# Patient Record
Sex: Female | Born: 1970 | Race: Black or African American | Hispanic: No | Marital: Single | Smoking: Former smoker
Health system: Southern US, Community
[De-identification: ages and names within clinical notes are randomized; demographics above are authoritative.]

## PROBLEM LIST (undated history)

## (undated) HISTORY — PX: ABDOMINAL HYSTERECTOMY: SHX81

## (undated) HISTORY — PX: TUBAL LIGATION: SHX77

---

## 2020-11-17 ENCOUNTER — Emergency Department (HOSPITAL_BASED_OUTPATIENT_CLINIC_OR_DEPARTMENT_OTHER)
Admission: EM | Admit: 2020-11-17 | Discharge: 2020-11-18 | Disposition: A | Discharge status: Home / Self Care | Payer: Self-pay | Attending: Emergency Medicine | Admitting: Emergency Medicine

## 2020-11-17 ENCOUNTER — Other Ambulatory Visit: Payer: Self-pay

## 2020-11-17 ENCOUNTER — Encounter (HOSPITAL_BASED_OUTPATIENT_CLINIC_OR_DEPARTMENT_OTHER): Payer: Self-pay | Admitting: Emergency Medicine

## 2020-11-17 ENCOUNTER — Emergency Department (HOSPITAL_BASED_OUTPATIENT_CLINIC_OR_DEPARTMENT_OTHER): Payer: Self-pay

## 2020-11-17 DIAGNOSIS — Z20822 Contact with and (suspected) exposure to covid-19: Secondary | ICD-10-CM | POA: Insufficient documentation

## 2020-11-17 DIAGNOSIS — R410 Disorientation, unspecified: Secondary | ICD-10-CM

## 2020-11-17 DIAGNOSIS — R4182 Altered mental status, unspecified: Secondary | ICD-10-CM | POA: Insufficient documentation

## 2020-11-17 DIAGNOSIS — F1290 Cannabis use, unspecified, uncomplicated: Secondary | ICD-10-CM | POA: Insufficient documentation

## 2020-11-17 DIAGNOSIS — R200 Anesthesia of skin: Secondary | ICD-10-CM | POA: Insufficient documentation

## 2020-11-17 DIAGNOSIS — R109 Unspecified abdominal pain: Secondary | ICD-10-CM | POA: Insufficient documentation

## 2020-11-17 DIAGNOSIS — F17210 Nicotine dependence, cigarettes, uncomplicated: Secondary | ICD-10-CM | POA: Insufficient documentation

## 2020-11-17 LAB — URINALYSIS, MICROSCOPIC (REFLEX): WBC, UA: NONE SEEN WBC/hpf (ref 0–5)

## 2020-11-17 LAB — COMPREHENSIVE METABOLIC PANEL
ALT: 10 U/L (ref 0–44)
AST: 16 U/L (ref 15–41)
Albumin: 4.3 g/dL (ref 3.5–5.0)
Alkaline Phosphatase: 85 U/L (ref 38–126)
Anion gap: 7 (ref 5–15)
BUN: <5 mg/dL — ABNORMAL LOW (ref 6–20)
CO2: 27 mmol/L (ref 22–32)
Calcium: 9.8 mg/dL (ref 8.9–10.3)
Chloride: 105 mmol/L (ref 98–111)
Creatinine, Ser: 0.87 mg/dL (ref 0.44–1.00)
GFR, Estimated: >60 mL/min (ref >60)
Glucose, Bld: 117 mg/dL — ABNORMAL HIGH (ref 70–99)
Potassium: 4 mmol/L (ref 3.5–5.1)
Sodium: 139 mmol/L (ref 135–145)
Total Bilirubin: 0.5 mg/dL (ref 0.3–1.2)
Total Protein: 8 g/dL (ref 6.5–8.1)

## 2020-11-17 LAB — RAPID URINE DRUG SCREEN, HOSP PERFORMED
Amphetamines: NOT DETECTED
Barbiturates: NOT DETECTED
Benzodiazepines: NOT DETECTED
Cocaine: NOT DETECTED
Opiates: NOT DETECTED
Tetrahydrocannabinol: POSITIVE — AB

## 2020-11-17 LAB — URINALYSIS, ROUTINE W REFLEX MICROSCOPIC
Bilirubin Urine: NEGATIVE
Glucose, UA: NEGATIVE mg/dL
Ketones, ur: NEGATIVE mg/dL
Leukocytes,Ua: NEGATIVE
Nitrite: NEGATIVE
Protein, ur: NEGATIVE mg/dL
Specific Gravity, Urine: <1.005 — ABNORMAL LOW (ref 1.005–1.030)
pH: 7 (ref 5.0–8.0)

## 2020-11-17 LAB — CBC
HCT: 39.1 % (ref 36.0–46.0)
Hemoglobin: 13.8 g/dL (ref 12.0–15.0)
MCH: 33.9 pg (ref 26.0–34.0)
MCHC: 35.3 g/dL (ref 30.0–36.0)
MCV: 96.1 fL (ref 80.0–100.0)
Platelets: 343 10*3/uL (ref 150–400)
RBC: 4.07 MIL/uL (ref 3.87–5.11)
RDW: 13.1 % (ref 11.5–15.5)
WBC: 11.8 10*3/uL — ABNORMAL HIGH (ref 4.0–10.5)
nRBC: 0 % (ref 0.0–0.2)

## 2020-11-17 LAB — RESP PANEL BY RT-PCR (FLU A&B, COVID) ARPGX2
Influenza A by PCR: NEGATIVE
Influenza B by PCR: NEGATIVE
SARS Coronavirus 2 by RT PCR: NEGATIVE

## 2020-11-17 LAB — PREGNANCY, URINE: Preg Test, Ur: NEGATIVE

## 2020-11-17 LAB — PHOSPHORUS: Phosphorus: 3.2 mg/dL (ref 2.5–4.6)

## 2020-11-17 LAB — MAGNESIUM: Magnesium: 1.9 mg/dL (ref 1.7–2.4)

## 2020-11-17 LAB — LIPASE, BLOOD: Lipase: 26 U/L (ref 11–51)

## 2020-11-17 IMAGING — CT CT HEAD W/O CM
3 series · 16 of 47 positions shown, 19 images · non-contrast
Comparison: None.

CLINICAL DATA: Mental status change

EXAM:
CT HEAD WITHOUT CONTRAST
TECHNIQUE: Contiguous axial images were obtained from the base of the skull
through the vertex without intravenous contrast.

[Series 2: head wo · axial · 0.43mm/px · z∈[+1058,+1188]mm · 10 of 32 slices shown, 13 images]
[im 3/32  brain]
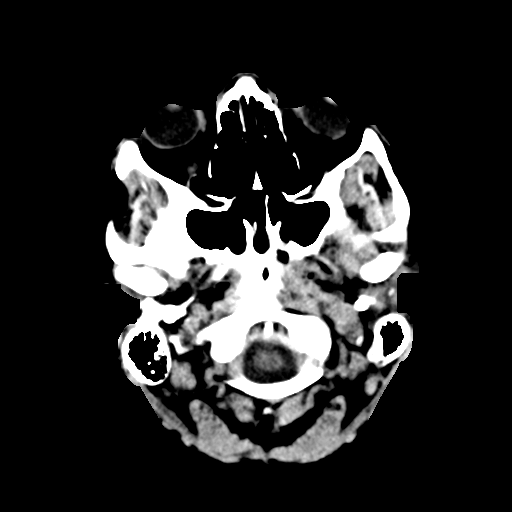
[im 3/32  bone]
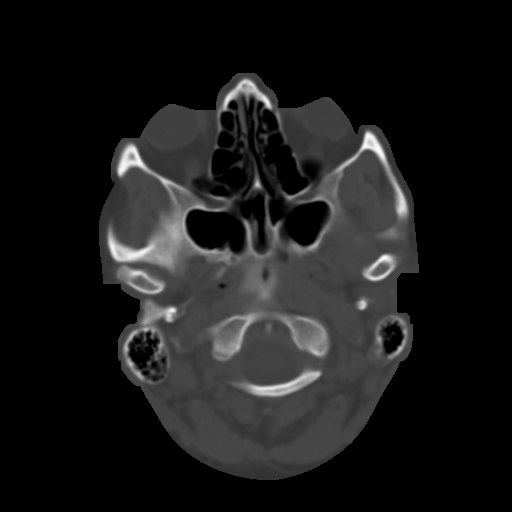
[im 6/32  brain]
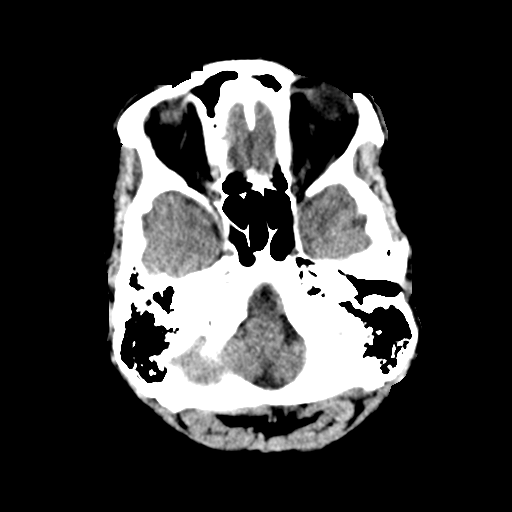
[im 9/32  brain]
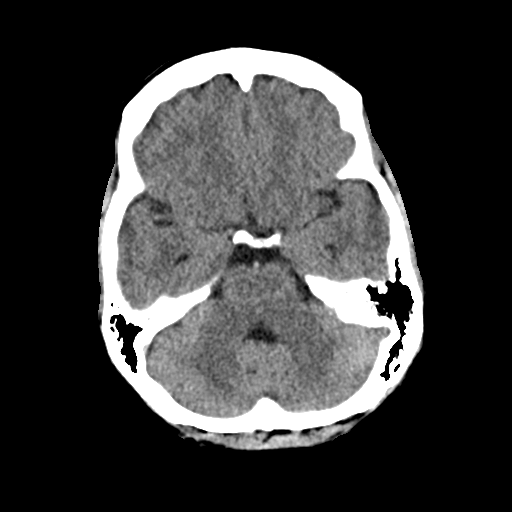
[im 11/32  brain]
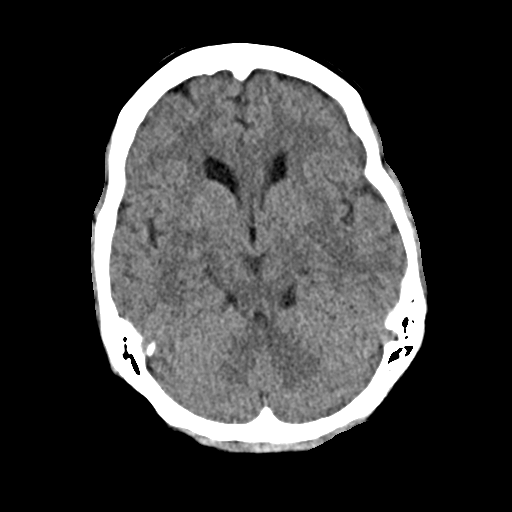
[im 14/32  brain]
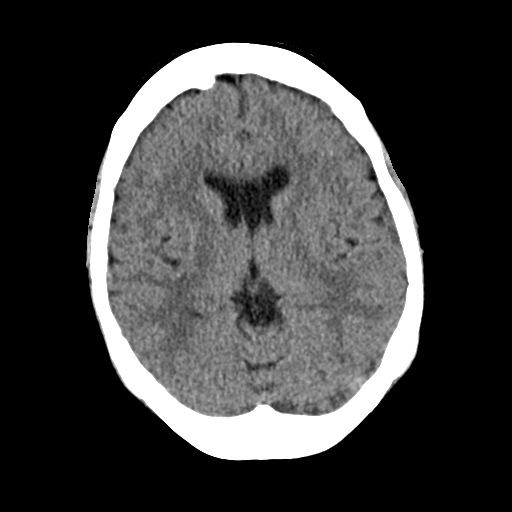
[im 14/32  bone]
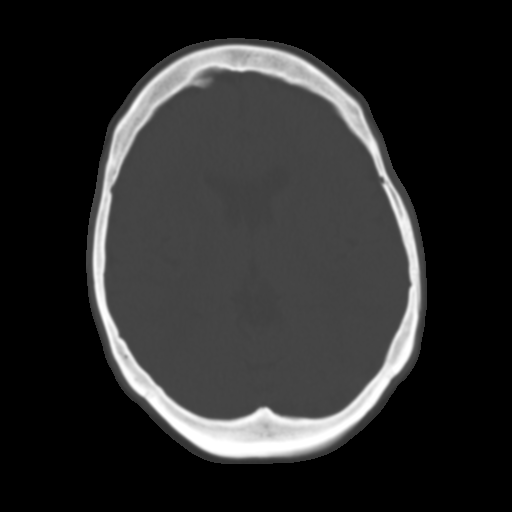
[im 18/32  brain]
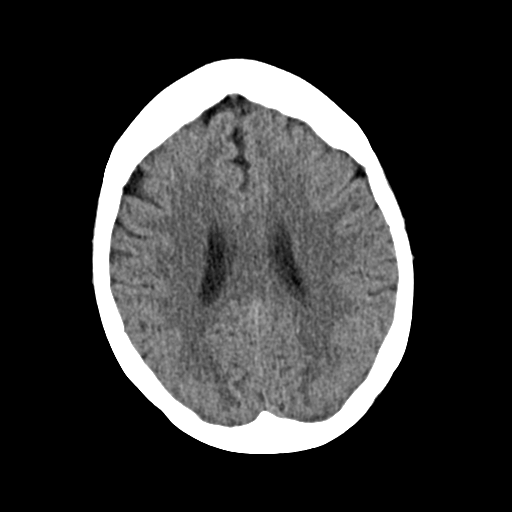
[im 21/32  brain]
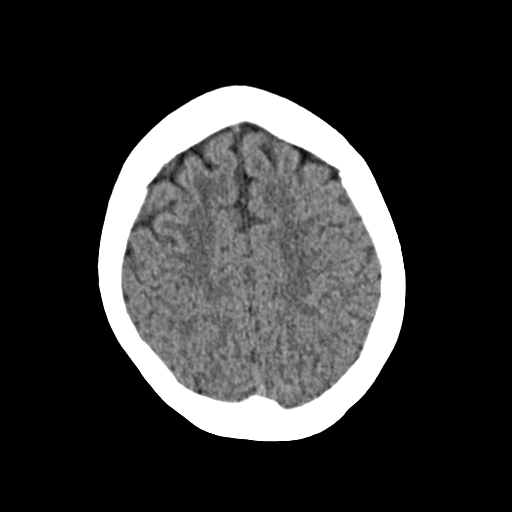
[im 24/32  brain]
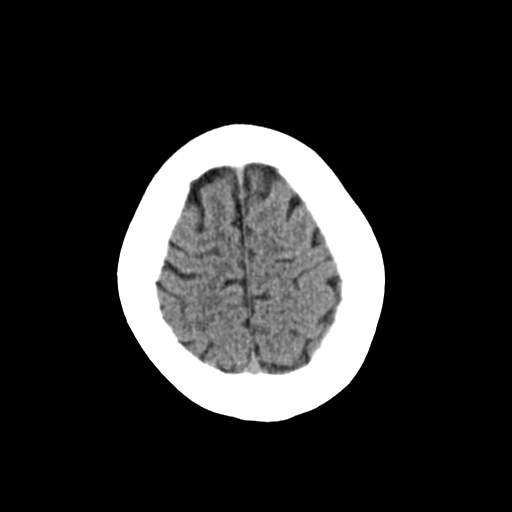
[im 26/32  brain]
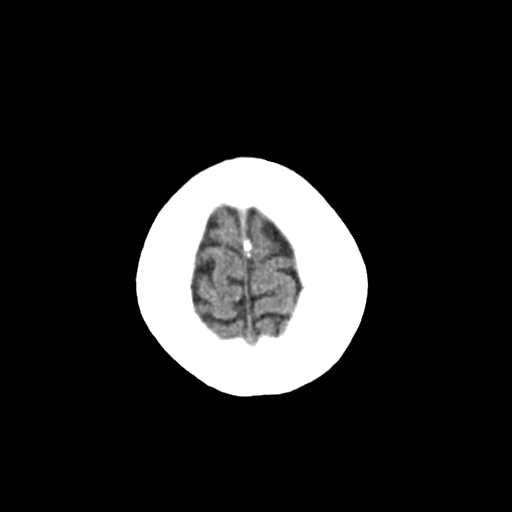
[im 26/32  bone]
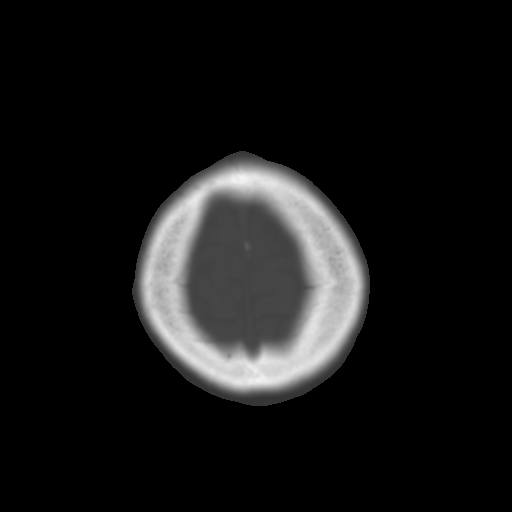
[im 29/32  brain]
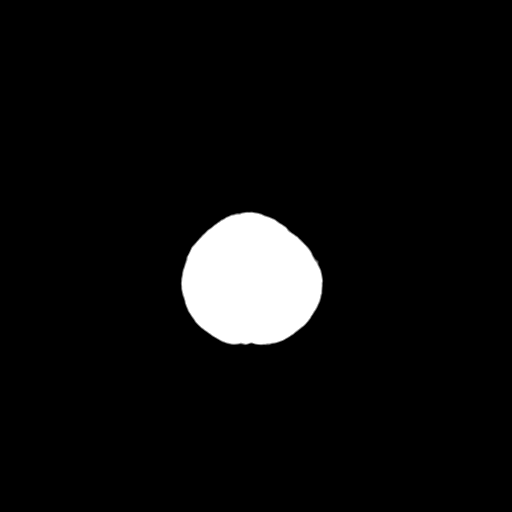

[Series 5: coronal soft · coronal · 0.32mm/px · 3 of 68 slices shown]
[im 23/68  brain]
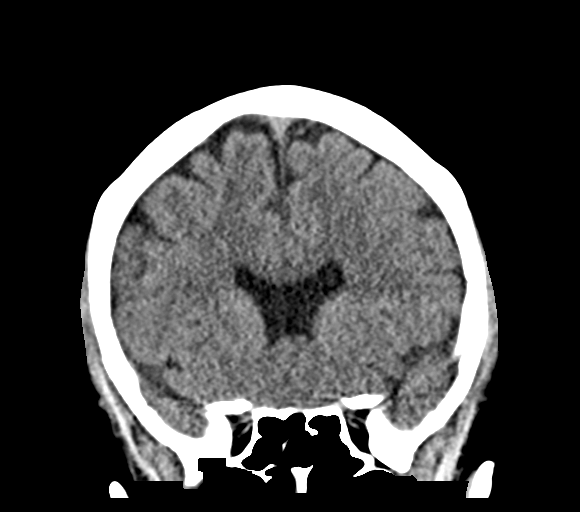
[im 30/68  brain]
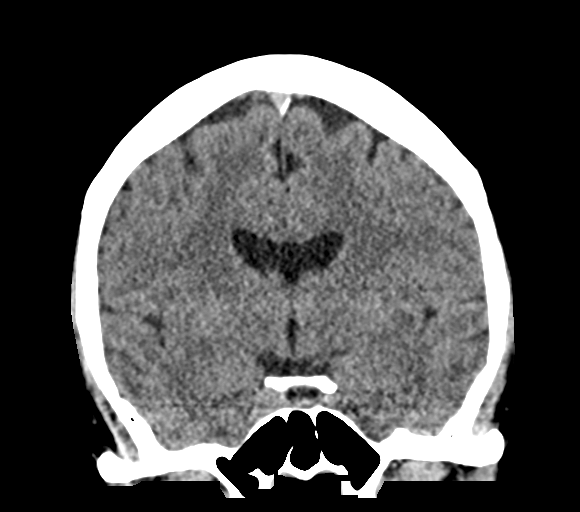
[im 38/68  brain]
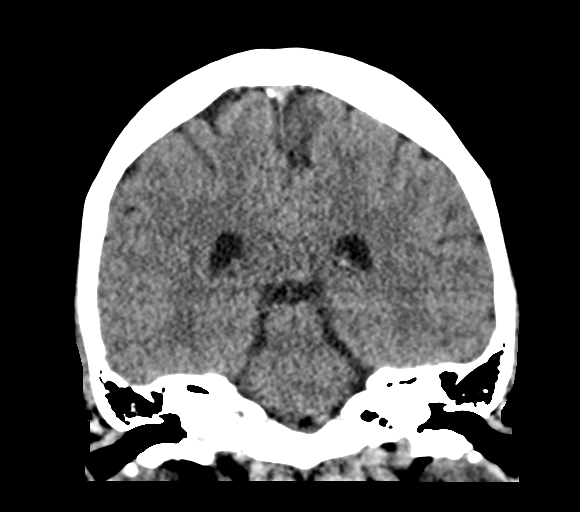

[Series 6: sag soft · sagittal · 0.32mm/px · 3 of 60 slices shown]
[im 20/60  brain]
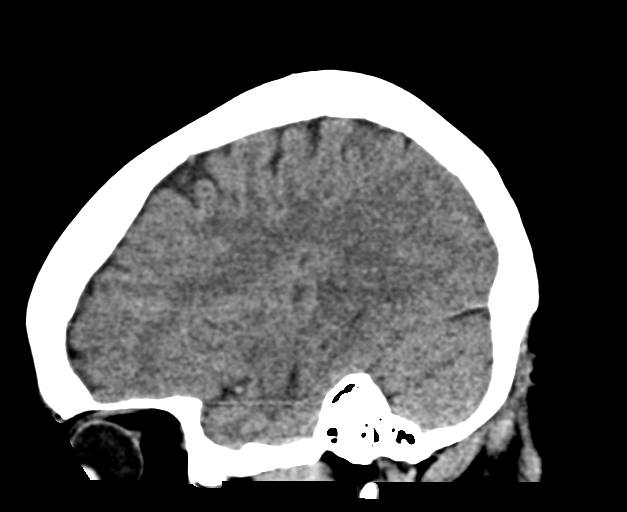
[im 30/60  brain]
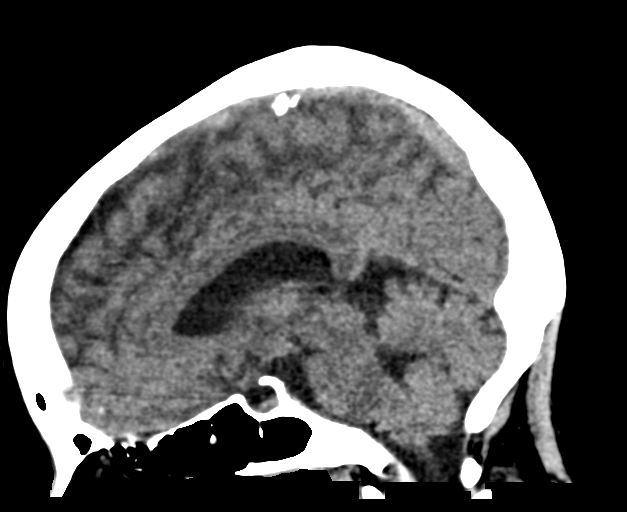
[im 40/60  brain]
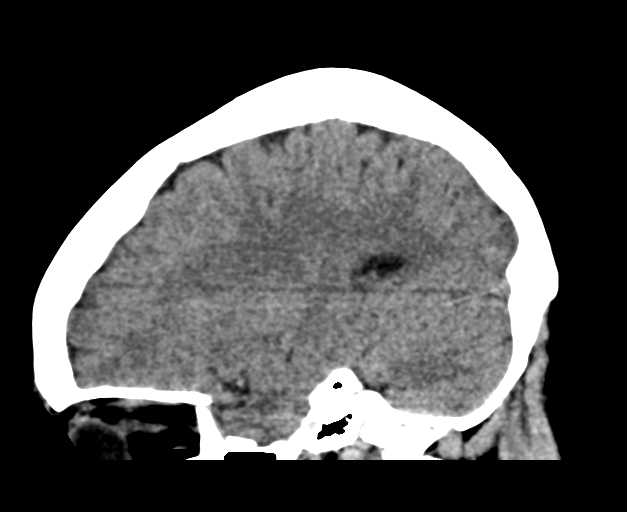

[16 of 47 positions shown; findings below may reference images not displayed]

FINDINGS: Brain: No evidence of acute infarction, hemorrhage, hydrocephalus,
extra-axial collection or mass lesion/mass effect.

Vascular: No hyperdense vessel or unexpected calcification.

Skull: Normal. Negative for fracture or focal lesion.

Sinuses/Orbits: No acute finding.

Other: None
IMPRESSION: Negative non contrasted CT appearance of the brain

## 2020-11-17 MED ORDER — LORAZEPAM 2 MG/ML IJ SOLN
1.0000 mg | Freq: Once | INTRAMUSCULAR | Status: AC | PRN
  Administered 2020-11-18: 1 mg via INTRAVENOUS
  Filled 2020-11-17: qty 1

## 2020-11-17 NOTE — ED Triage Notes (Signed)
Reports having abdominal pain for the last four days in the morning.  Today went on all day.  Also reports sudden onset of confusion at 2pm.  Was seen by ems and was told it was probably dehydration.  Reports she drank a lot of water and gatorade.   Was told to come in if she felt worse again.  Now reports right arm tingling and feels weaker than the left.

## 2020-11-17 NOTE — ED Provider Notes (Signed)
[facility] EMERGENCY DEPARTMENT Provider Note   CSN: [id number] Arrival date & time: 11/17/20  1832     History Chief Complaint  Patient presents with   Abdominal Pain    Victoria Stevenson is a 50 y.o. female.  HPI Reports she was sitting out with her daughter and granddaughter visiting this afternoon.  They were on a shaded porch.  She reports that they were enjoying themselves and she very suddenly felt extremely thirsty.  She reports quite quickly then she felt like she was disoriented and like the right side of her face was contracting.  She reports at that time she had a hard time organizing her speech.  She reports she was talking but things were not coming out correctly.  She was able to walk into the house and sit down, noting at that time that her right arm had a sensation of numbness to it.  She reports she can move it and use it but the arm felt like it was numb.  This did not include the left arm or the lower extremities.  She was still trying to talk to her daughter but her daughter is having a hard time understanding her.  She reports it just seem like was taking her a lot longer to be able to find the words and to get them out.  EMS was called and assessed the patient.  She reports at that time it was thought that maybe she had a panic attack.  However, she notes she does not really know what a panic tach is and she has never been diagnosed with it before.  History does not suggest that she was under a lot of stress at that particular juncture. patient denies any history of similar episode.  He has not have medical problems.  Patient reports she does smoke cigarettes.  She reports rare marijuana use, last use about a week ago. Rare alcohol use, none recently.  Patient reports only noticing this been going on is that she has been getting intermittent cramping abdominal pains.  She has not had any vomiting or diarrhea.  No fevers.  No generalized headaches or body aches.  She  reports she will occasionally get a pain that feels like she has to have a bowel movement.     History reviewed. No pertinent past medical history.  There are no problems to display for this patient.   History reviewed. No pertinent surgical history.   OB History   No obstetric history on file.     No family history on file.  Social History   Tobacco Use   Smoking status: Every Day    Types: Cigarettes  Substance Use Topics   Alcohol use: Yes    Home Medications Prior to Admission medications   Not on File    Allergies    Patient has no allergy information on record.  Review of Systems   Review of Systems 10 Systems reviewed and negative except as per HPI Physical Exam Updated Vital Signs BP (!) 124/91 (BP Location: Left Arm)   Pulse 68   Temp 98.4 F (36.9 C) (Oral)   Resp 18   Ht 5\' 7"  (1.702 m)   Wt 79.4 kg   LMP 10/03/2020 (Approximate) Comment: very rarely has a period  SpO2 99%   BMI 27.41 kg/m   Physical Exam Constitutional:      Appearance: Normal appearance.  HENT:     Head: Normocephalic and atraumatic.     Mouth/Throat:  Mouth: Mucous membranes are moist.     Pharynx: Oropharynx is clear.  Eyes:     Extraocular Movements: Extraocular movements intact.     Conjunctiva/sclera: Conjunctivae normal.     Pupils: Pupils are equal, round, and reactive to light.  Cardiovascular:     Rate and Rhythm: Normal rate and regular rhythm.  Pulmonary:     Effort: Pulmonary effort is normal.     Breath sounds: Normal breath sounds.  Abdominal:     General: There is no distension.     Palpations: Abdomen is soft.     Tenderness: There is no abdominal tenderness. There is no guarding.  Musculoskeletal:        General: No swelling or tenderness. Normal range of motion.     Cervical back: Neck supple.     Right lower leg: No edema.     Left lower leg: No edema.  Skin:    General: Skin is warm and dry.  Neurological:     General: No focal deficit  present.     Mental Status: She is alert and oriented to person, place, and time.     Cranial Nerves: No cranial nerve deficit.     Sensory: No sensory deficit.     Motor: No weakness.     Coordination: Coordination normal.     Comments: speech is clear.  No aphasia.  Follows commands without difficulty.  Cranial nerves II through XII intact.  Motor strength intact 5\5 upper and lower extremities.  Normal finger-nose exam.  Sensation intact to light touch x4 extremities.  Symmetric on the face.  Psychiatric:        Mood and Affect: Mood normal.    ED Results / Procedures / Treatments   Labs (all labs ordered are listed, but only abnormal results are displayed) Labs Reviewed  COMPREHENSIVE METABOLIC PANEL - Abnormal; Notable for the following components:      Result Value   Glucose, Bld 117 (*)    BUN <5 (*)    All other components within normal limits  CBC - Abnormal; Notable for the following components:   WBC 11.8 (*)    All other components within normal limits  URINALYSIS, ROUTINE W REFLEX MICROSCOPIC - Abnormal; Notable for the following components:   Color, Urine AMBER (*)    Specific Gravity, Urine <1.005 (*)    Hgb urine dipstick SMALL (*)    All other components within normal limits  URINALYSIS, MICROSCOPIC (REFLEX) - Abnormal; Notable for the following components:   Bacteria, UA RARE (*)    All other components within normal limits  RAPID URINE DRUG SCREEN, HOSP PERFORMED - Abnormal; Notable for the following components:   Tetrahydrocannabinol POSITIVE (*)    All other components within normal limits  RESP PANEL BY RT-PCR (FLU A&B, COVID) ARPGX2  LIPASE, BLOOD  PREGNANCY, URINE  MAGNESIUM  PHOSPHORUS  TSH    EKG EKG Interpretation  Date/Time:  Friday November 17 2020 23:37:44 EDT Ventricular Rate:  69 PR Interval:  145 QRS Duration: 88 QT Interval:  403 QTC Calculation: 432 R Axis:   14 Text Interpretation: Sinus arrhythmia Borderline T abnormalities,  anterior leads No previous ECGs available Confirmed by Glynn Octave [phone]) on 11/17/2020 11:41:36 PM Also confirmed by Glynn Octave [phone]), editor Jillene Bucks [phone])  on 11/18/2020 11:34:29 AM  Radiology CT HEAD WO CONTRAST ( )  Result Date: 11/17/2020 CLINICAL DATA:  Mental status change EXAM: CT HEAD WITHOUT CONTRAST TECHNIQUE: Contiguous axial images were obtained from  the base of the skull through the vertex without intravenous contrast. COMPARISON:  None. FINDINGS: Brain: No evidence of acute infarction, hemorrhage, hydrocephalus, extra-axial collection or mass lesion/mass effect. Vascular: No hyperdense vessel or unexpected calcification. Skull: Normal. Negative for fracture or focal lesion. Sinuses/Orbits: No acute finding. Other: None IMPRESSION: Negative non contrasted CT appearance of the brain Electronically Signed   By: Jasmine Pang M.D.   On: 11/17/2020 19:27    Procedures Procedures   Medications Ordered in ED Medications  LORazepam (ATIVAN) injection 1 mg (1 mg Intravenous Given 11/18/20 0458)  gadobutrol (GADAVIST) 1 MMOL/ML injection 8 mL (8 mLs Intravenous Contrast Given 11/18/20 3716)    ED Course  I have reviewed the triage vital signs and the nursing notes.  Pertinent labs & imaging results that were available during my care of the patient were reviewed by me and considered in my medical decision making (see chart for details).    MDM Rules/Calculators/A&P                           Patient presents as outlined.  Patient describes heat exhaustion but also right arm numbness with question for possible TIA.  Reviewed with neurology.  Plan for MRI evaluation.  Diagnostic evaluation without suggestion for ACS or other emergent cardiac or pulmonary condition.  Patient stable at time of transfer for completion of diagnostic evaluation. Final Clinical Impression(s) / ED Diagnoses Final diagnoses:  Right arm numbness  Acute confusion    Rx / DC Orders ED Discharge  Orders     None        Arby Barrette, MD 12/16/20 1358

## 2020-11-18 ENCOUNTER — Emergency Department (HOSPITAL_COMMUNITY): Payer: Self-pay

## 2020-11-18 LAB — TSH: TSH: 1.159 u[IU]/mL (ref 0.350–4.500)

## 2020-11-18 IMAGING — MR MR HEAD WO/W CM
7 of 13 series · 21 of 48 positions shown · IV contrast (agent unspecified)
Comparison: None.

CLINICAL DATA: Mental status change with stroke suspected



[Series 3: DWI · axial · 3.0mm · 0.94mm/px · z∈[-79,+72]mm · 6 of 104 slices shown (1 of 2)]
[im 1/104]
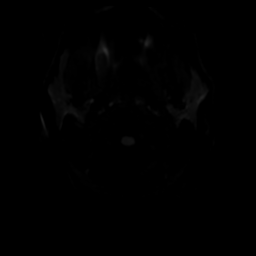
[im 21/104]
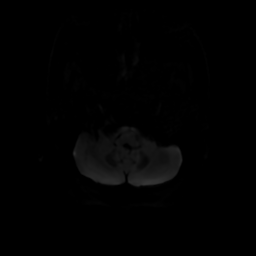
[im 42/104]
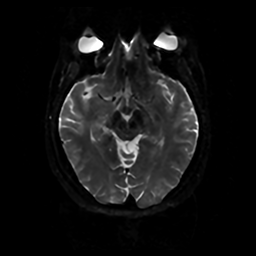
[im 62/104]
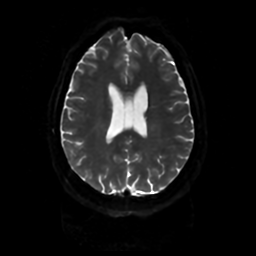
[im 83/104]
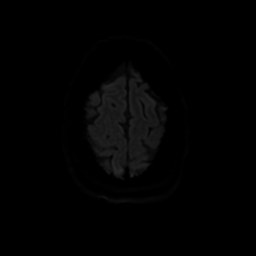
[im 104/104]
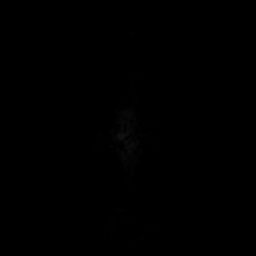

[Series 4: DWI · coronal · 4.0mm · 0.94mm/px · 5 of 74 slices shown (2 of 2)]
[im 1/74]
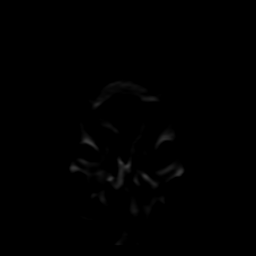
[im 19/74]
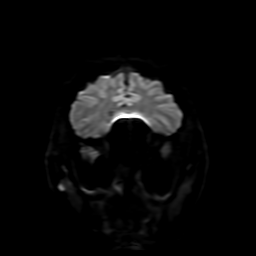
[im 37/74]
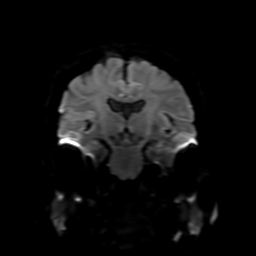
[im 55/74]
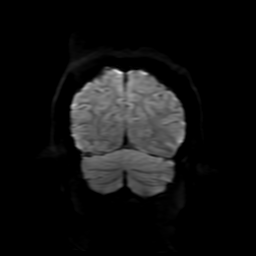
[im 74/74]
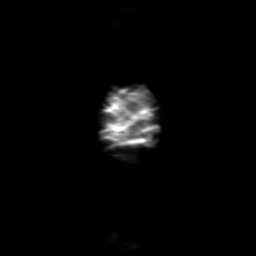

[Series 5: FLAIR · sagittal · 5.0mm · 0.23mm/px · 2 of 27 slices shown (1 of 2)]
[im 1/27]
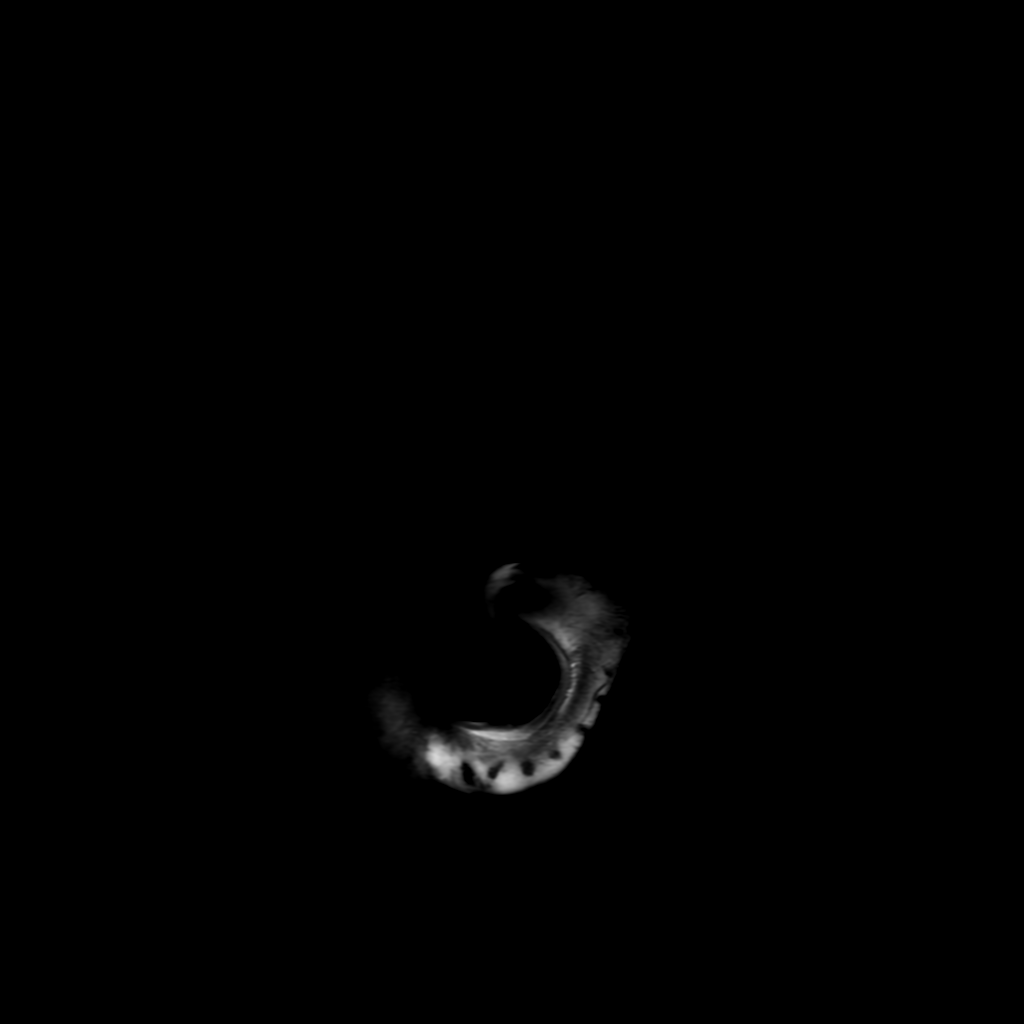
[im 27/27]
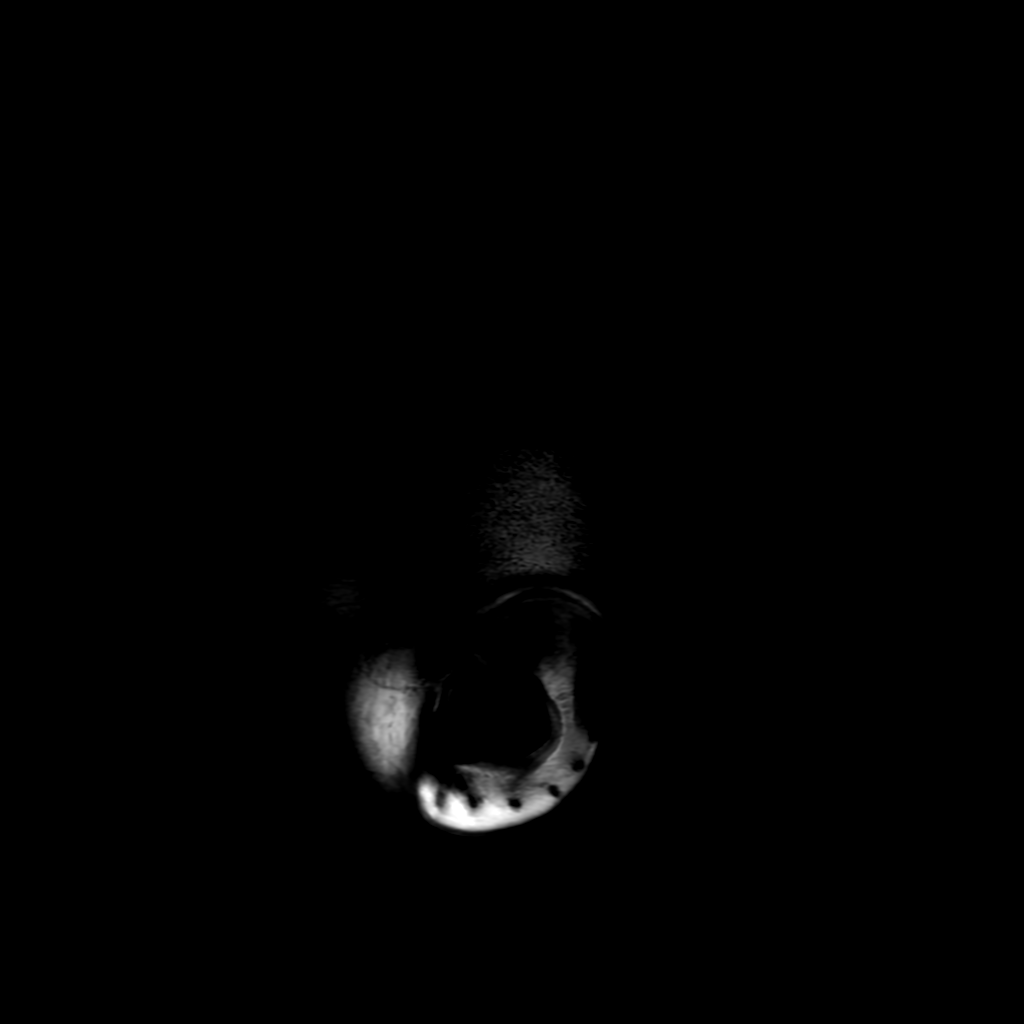

[Series 6: T2 · axial · 5.0mm · 0.23mm/px · 1 of 26 slices shown]
[im 1/26]
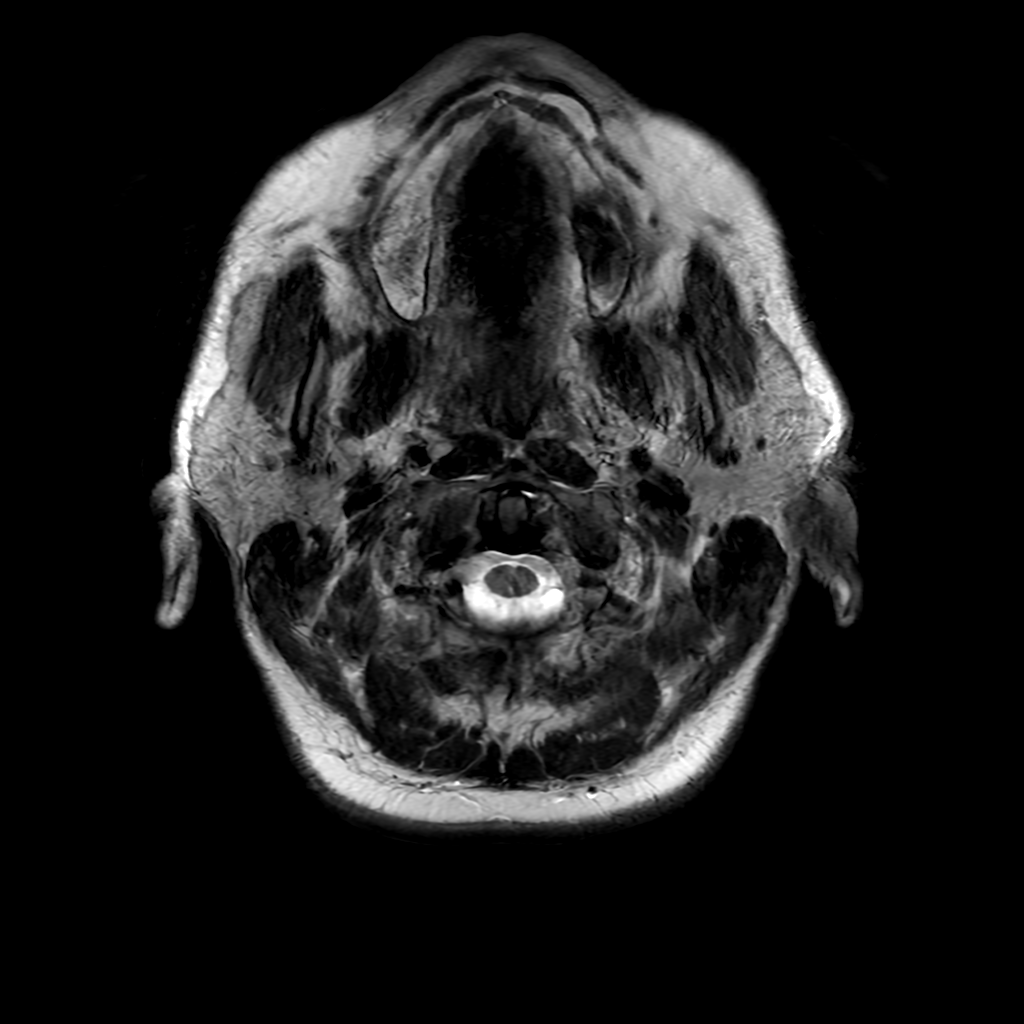

[Series 7: FLAIR · axial · 4.0mm · 0.45mm/px · z∈[-86,+65]mm · 2 of 36 slices shown (2 of 2)]
[im 1/36]
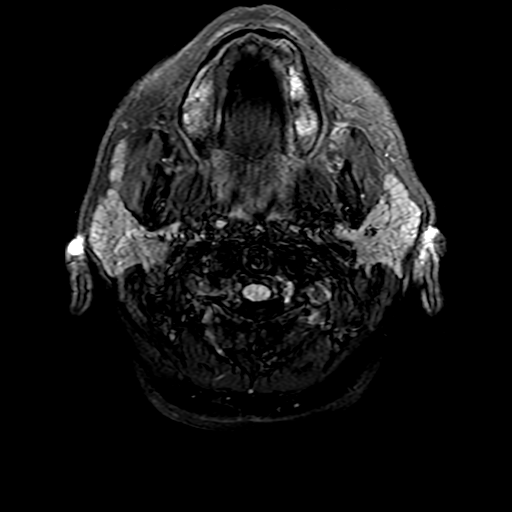
[im 36/36]
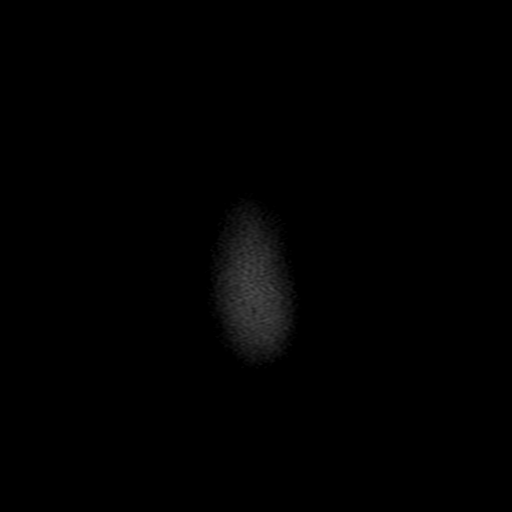

[Series 350: ADC · axial · 3.0mm · 0.94mm/px · z∈[-79,+72]mm · 3 of 52 slices shown (1 of 2)]
[im 1/52]
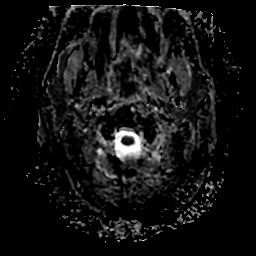
[im 26/52]
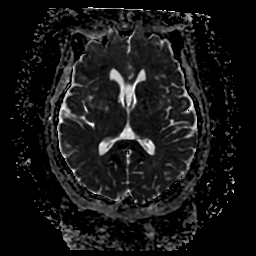
[im 52/52]
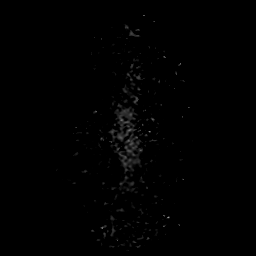

[Series 450: ADC · coronal · 4.0mm · 0.94mm/px · 2 of 37 slices shown (2 of 2)]
[im 1/37]
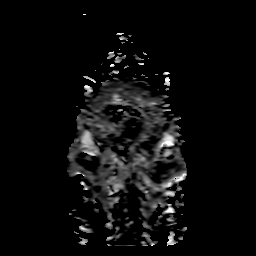
[im 37/37]
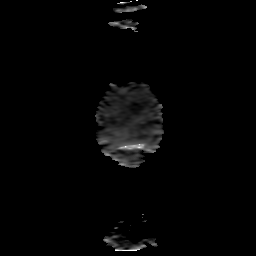

[21 of 48 positions shown; findings below may reference images not displayed]

FINDINGS: MRI HEAD FINDINGS

Brain: No acute infarction, hemorrhage, hydrocephalus, extra-axial
collection or mass lesion. Two or 3 tiny remote white matter
insults, age congruent. Normal brain volume

Vascular: Preserved flow voids and vascular enhancements

Skull and upper cervical spine: Normal marrow signal

Sinuses/Orbits: Fluid signal in the right petrous apex without
septal destruction, restricted diffusion, or T1 shortening, likely
trapped secretions. No superimposed inflammation seen on
postcontrast imaging.

MRA HEAD FINDINGS

Vessels are smooth and widely patent. Negative for aneurysm or signs
of vascular malformation. No noted atheromatous disease.

MRA NECK FINDINGS

Antegrade flow in the carotid and vertebral arteries by
time-of-flight technique.

No significant finding on neck mask.

Postcontrast neck MRA is limited by patient motion and bolus
diffusion. Patient was sleeping and catheter kinking are underlying
contributing factors per report. Evaluation of the vertebral
arteries is especially limited by intravenous plexus enhancement,
particularly on the left. There is cervical ICA tortuosity more
notable on the left. No evidence of major vessel stenosis, beading,
or dissection.
IMPRESSION: Brain MRI:

1. Negative brain.  No infarct or other explanation for symptoms.
2. Trapped secretions in the right petrous apex.

Intracranial and neck MRA:

Negative.

## 2020-11-18 IMAGING — MR MR MRA HEAD W/O CM
2 series · 19 of 48 positions shown · IV contrast (agent unspecified)
Comparison: None.

CLINICAL DATA: Mental status change with stroke suspected



[Series 2: ax (id) · axial · 1.0mm · 0.43mm/px · z∈[-60,+23]mm · 18 of 176 slices shown]
[im 1/176]
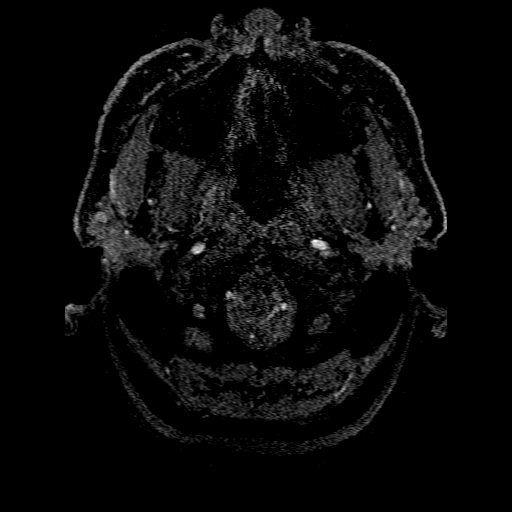
[im 4/176]
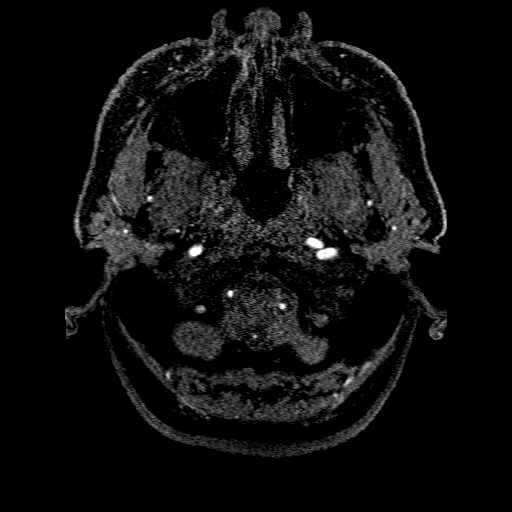
[im 8/176]
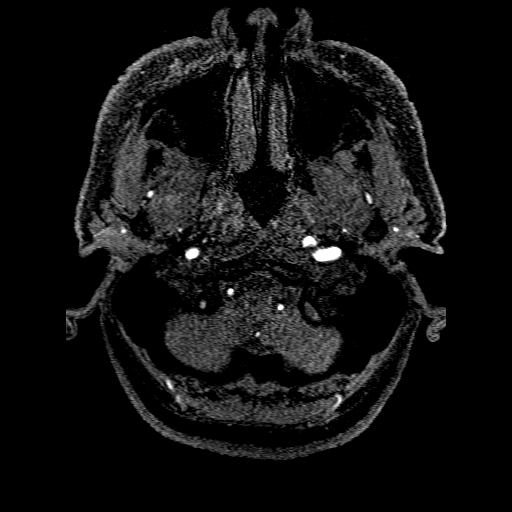
[im 12/176]
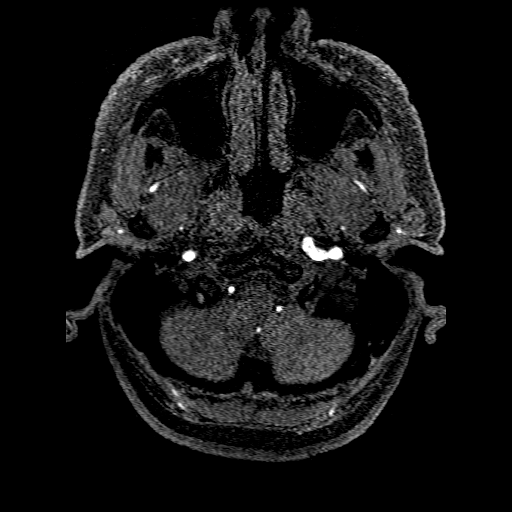
[im 16/176]
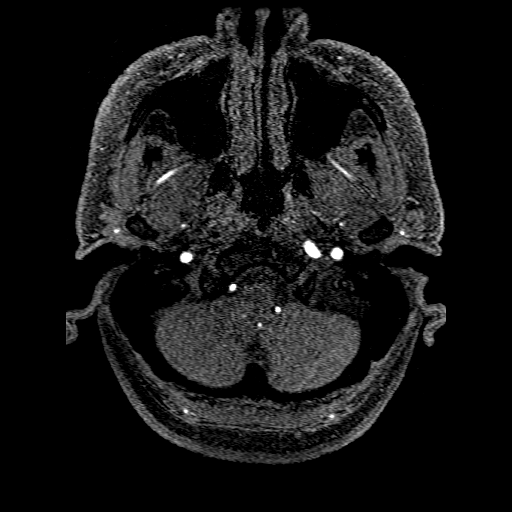
[im 20/176]
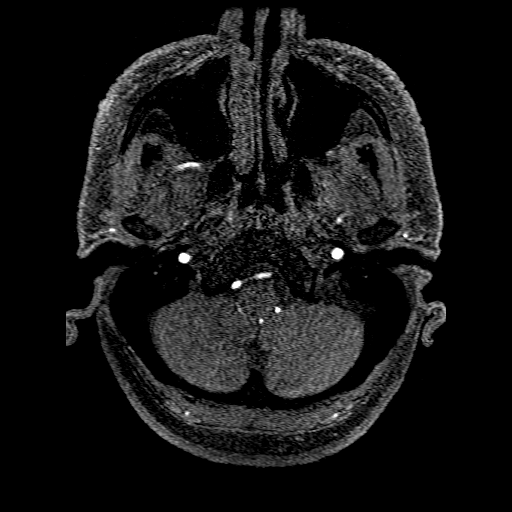
[im 23/176]
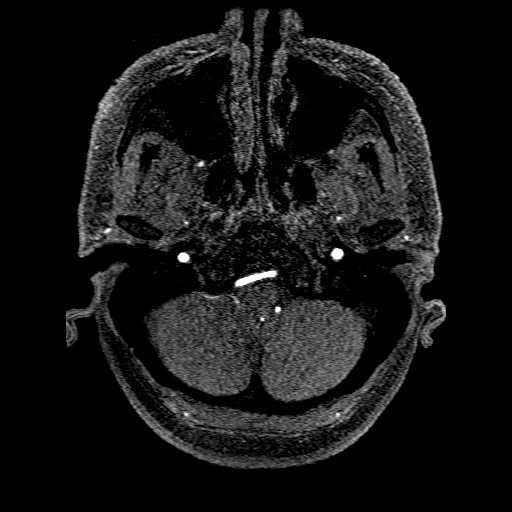
[im 27/176]
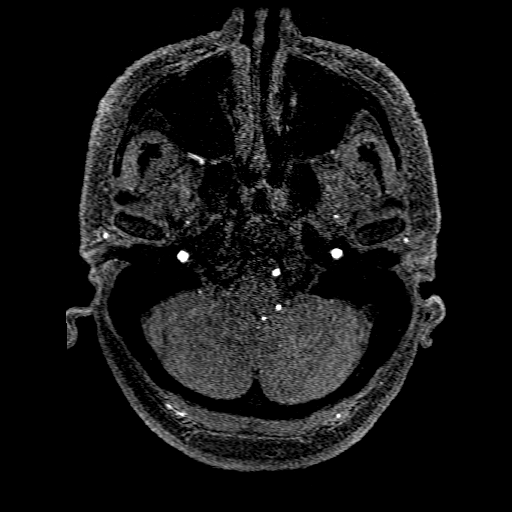
[im 31/176]
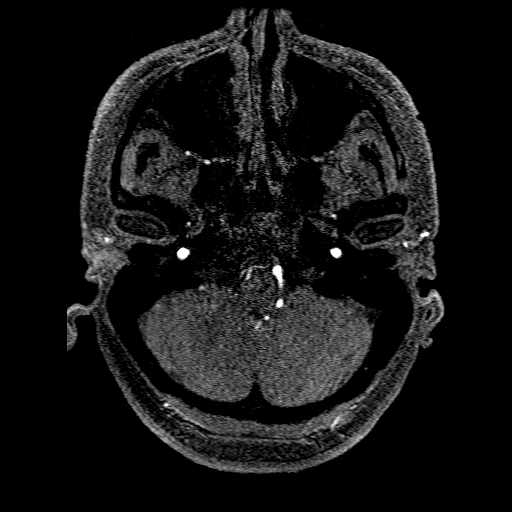
[im 35/176]
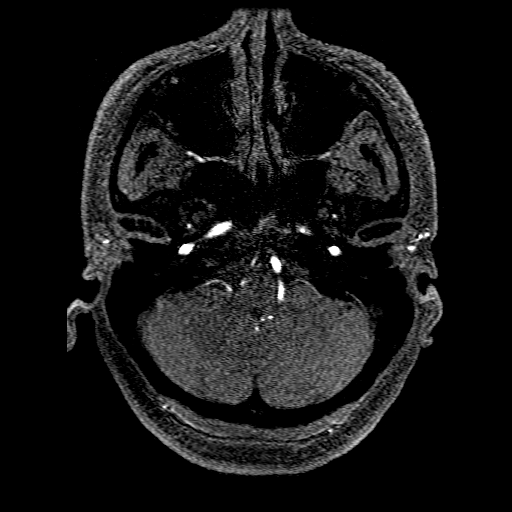
[im 54/176]
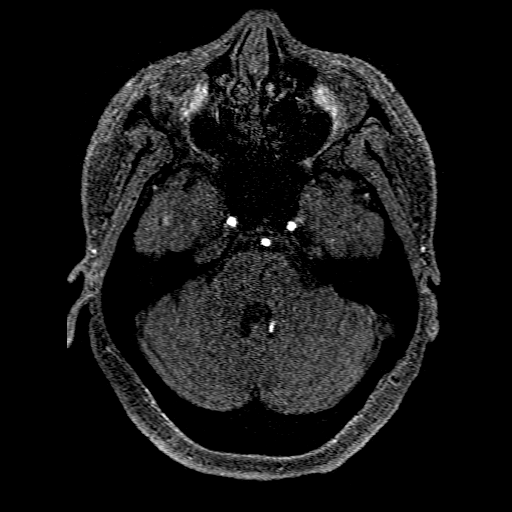
[im 77/176]
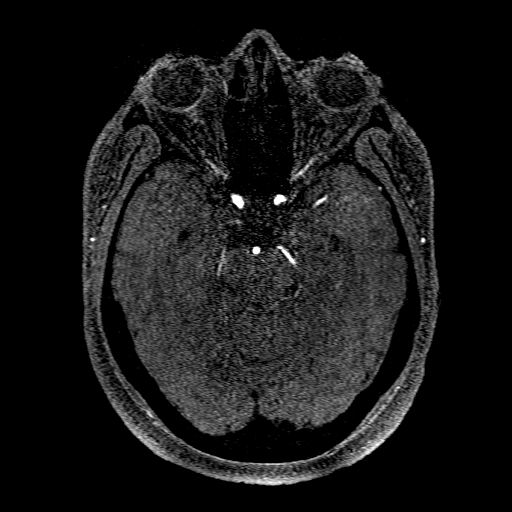
[im 88/176]
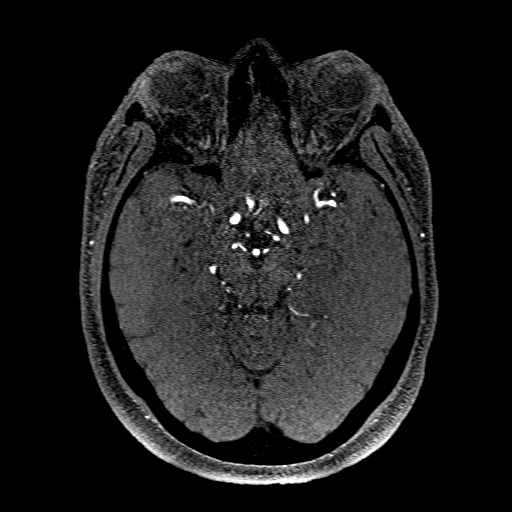
[im 99/176]
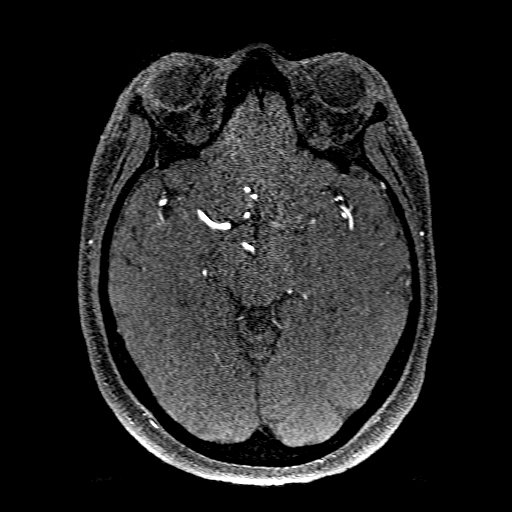
[im 122/176]
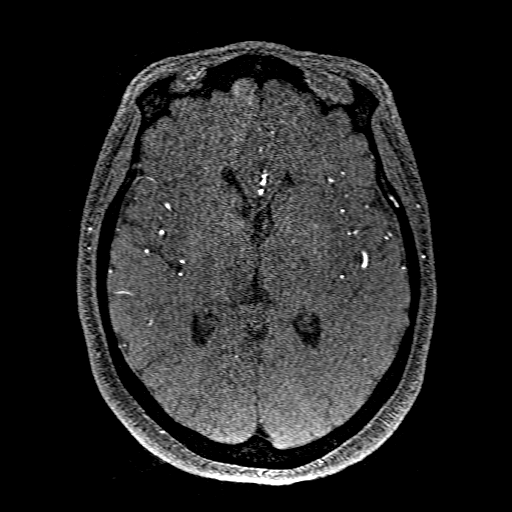
[im 145/176]
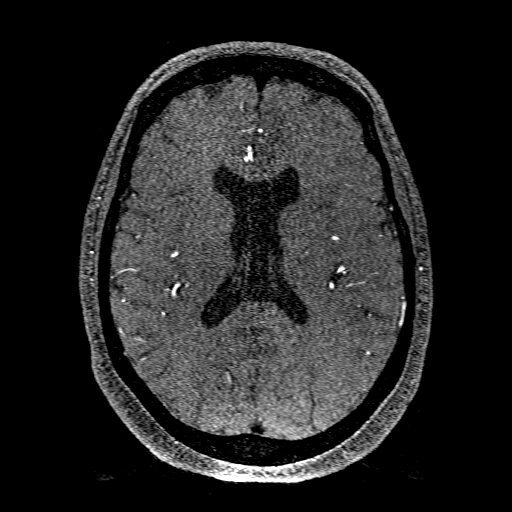
[im 149/176]
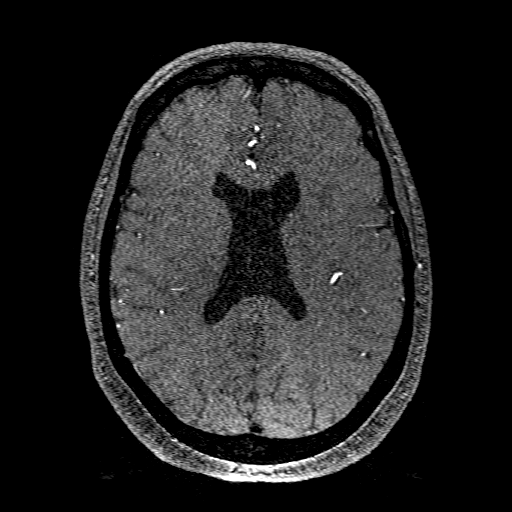
[im 168/176]
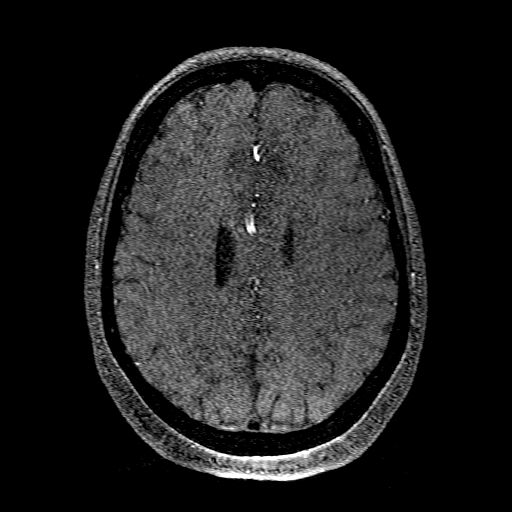

[Series 201: pjn:ax (id) · sagittal · 1.0mm · 0.43mm/px · 1 of 4 slices shown]
[im 1/4]
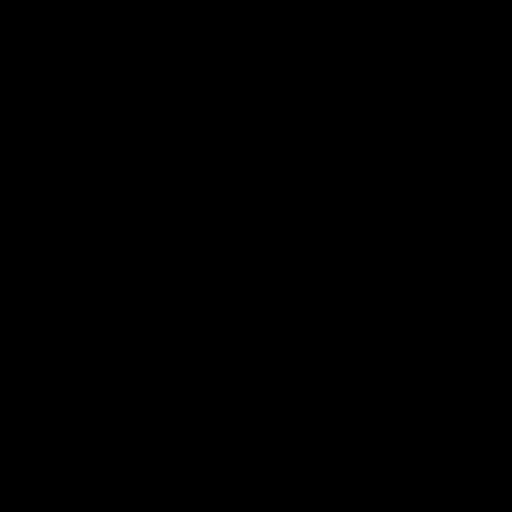

[19 of 48 positions shown; findings below may reference images not displayed]

FINDINGS: MRI HEAD FINDINGS

Brain: No acute infarction, hemorrhage, hydrocephalus, extra-axial
collection or mass lesion. Two or 3 tiny remote white matter
insults, age congruent. Normal brain volume

Vascular: Preserved flow voids and vascular enhancements

Skull and upper cervical spine: Normal marrow signal

Sinuses/Orbits: Fluid signal in the right petrous apex without
septal destruction, restricted diffusion, or T1 shortening, likely
trapped secretions. No superimposed inflammation seen on
postcontrast imaging.

MRA HEAD FINDINGS

Vessels are smooth and widely patent. Negative for aneurysm or signs
of vascular malformation. No noted atheromatous disease.

MRA NECK FINDINGS

Antegrade flow in the carotid and vertebral arteries by
time-of-flight technique.

No significant finding on neck mask.

Postcontrast neck MRA is limited by patient motion and bolus
diffusion. Patient was sleeping and catheter kinking are underlying
contributing factors per report. Evaluation of the vertebral
arteries is especially limited by intravenous plexus enhancement,
particularly on the left. There is cervical ICA tortuosity more
notable on the left. No evidence of major vessel stenosis, beading,
or dissection.
IMPRESSION: Brain MRI:

1. Negative brain.  No infarct or other explanation for symptoms.
2. Trapped secretions in the right petrous apex.

Intracranial and neck MRA:

Negative.

## 2020-11-18 IMAGING — MR MR MRA NECK WO/W CM
12 of 21 series · 17 of 48 positions shown · IV contrast (agent unspecified)
Comparison: None.

CLINICAL DATA: Mental status change with stroke suspected



[Series 3: DWI · axial · 3.0mm · 0.94mm/px · z∈[-79,+72]mm · 2 of 104 slices shown (1 of 2)]
[im 1/104]
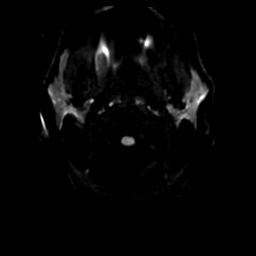
[im 104/104]
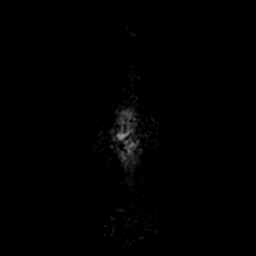

[Series 4: DWI · coronal · 4.0mm · 0.94mm/px · 1 of 74 slices shown (2 of 2)]
[im 1/74]
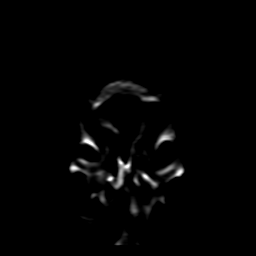

[Series 5: FLAIR · sagittal · 5.0mm · 0.23mm/px · 1 of 27 slices shown (1 of 2)]
[im 1/27]
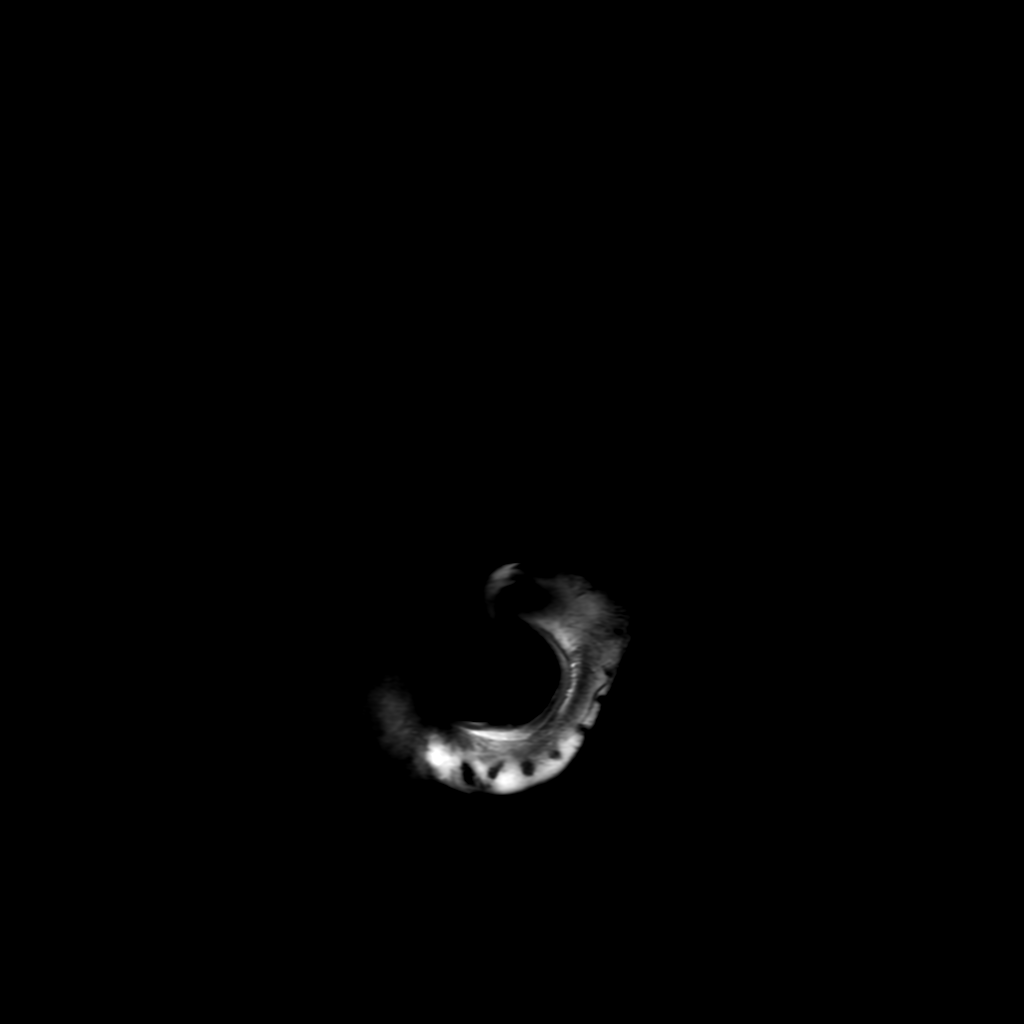

[Series 6: T2 · axial · 5.0mm · 0.23mm/px · 1 of 26 slices shown]
[im 1/26]
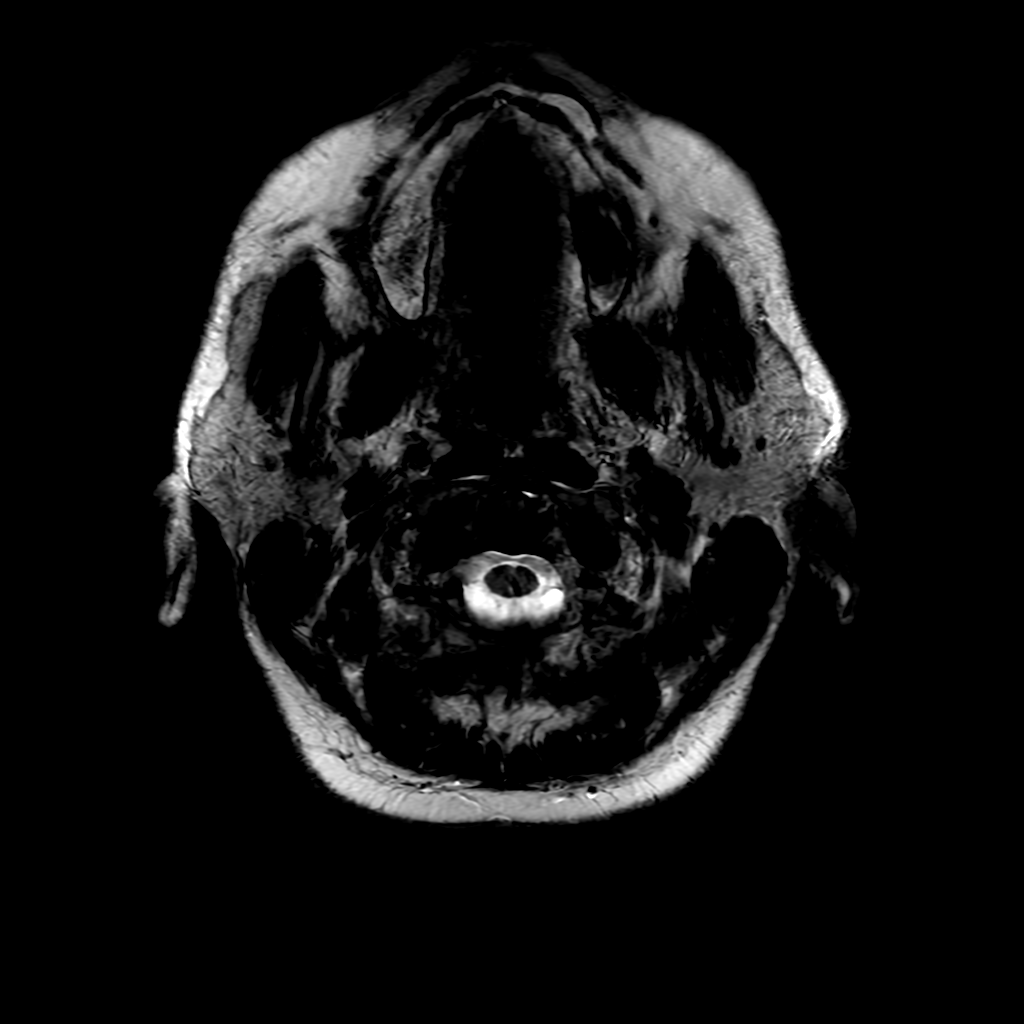

[Series 7: FLAIR · axial · 4.0mm · 0.45mm/px · 1 of 36 slices shown (2 of 2)]
[im 1/36]
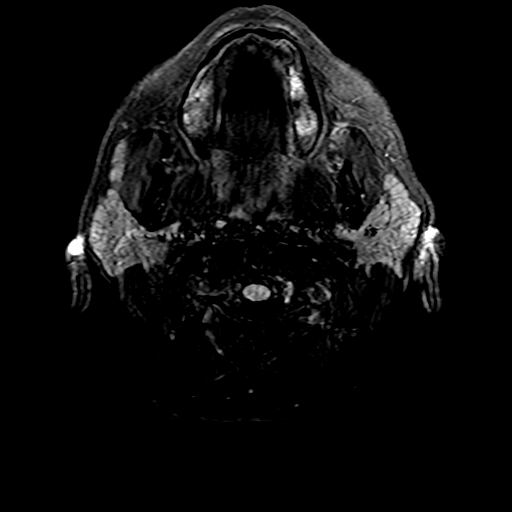

[Series 8: (person_name) · axial · 3.0mm · 0.47mm/px · z∈[-82,+64]mm · 3 of 100 slices shown]
[im 1/100]
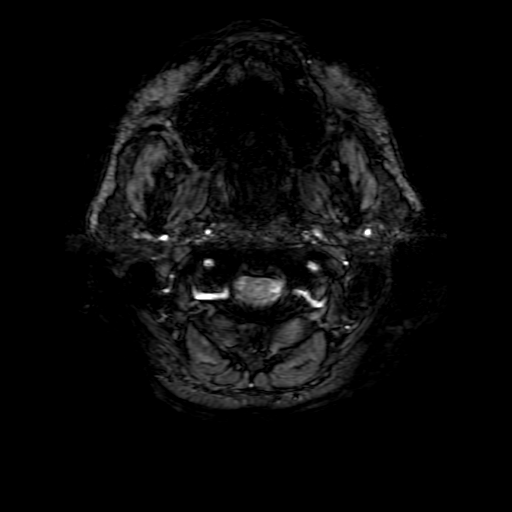
[im 50/100]
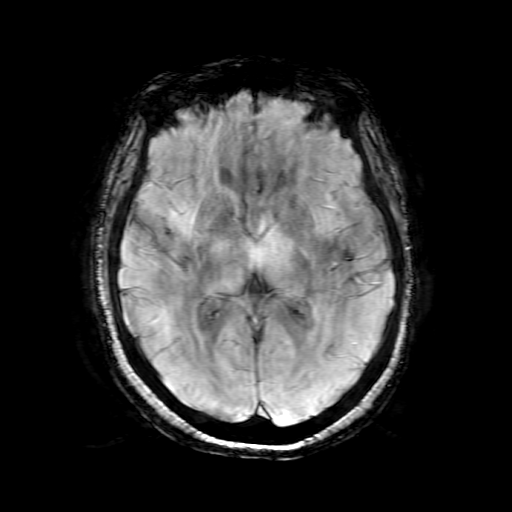
[im 100/100]
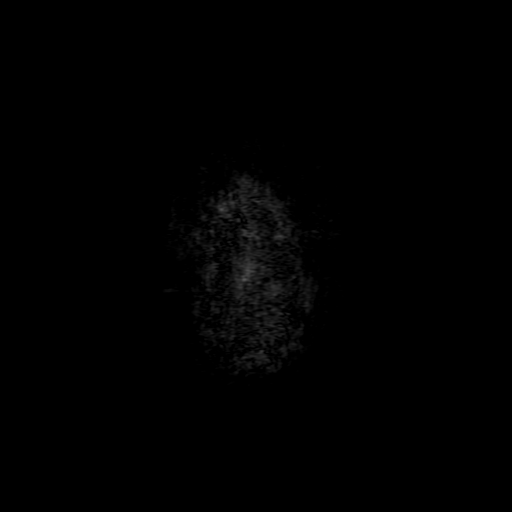

[Series 9: ax 3(person_name) pre · axial · non-contrast · 3.0mm · 0.94mm/px · 1 of 56 slices shown]
[im 1/56]
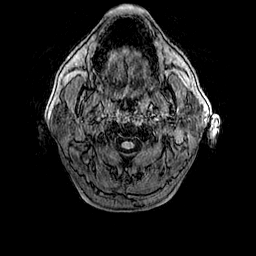

[Series 13: T2 post-contrast · coronal · 5.0mm · 0.20mm/px · 1 of 31 slices shown]
[im 1/31]
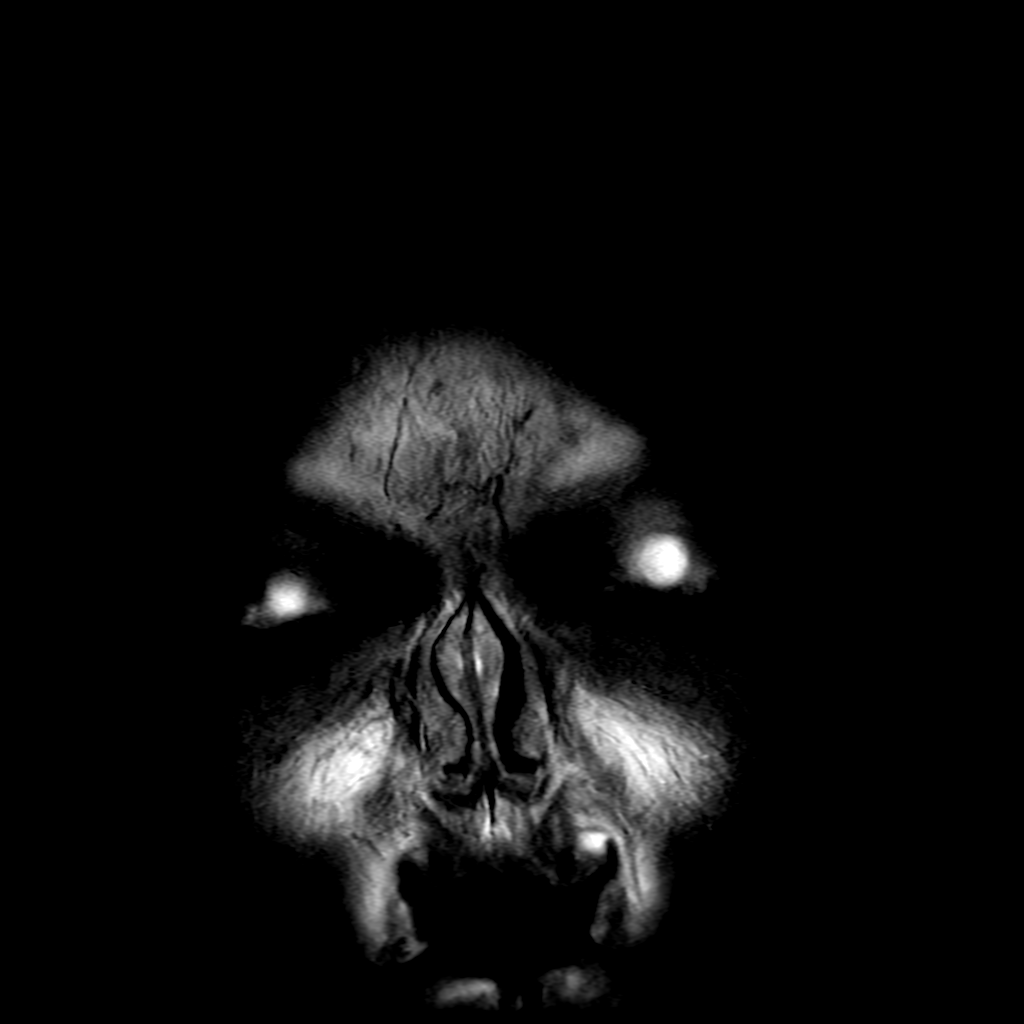

[Series 14: T1 · axial · 3.0mm · 0.94mm/px · z∈[-95,+67]mm · 2 of 56 slices shown (1 of 2)]
[im 1/56]
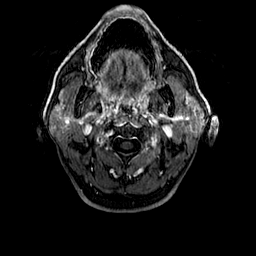
[im 56/56]
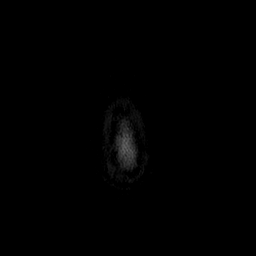

[Series 15: T1 · coronal · 5.0mm · 0.39mm/px · 1 of 31 slices shown (2 of 2)]
[im 1/31]
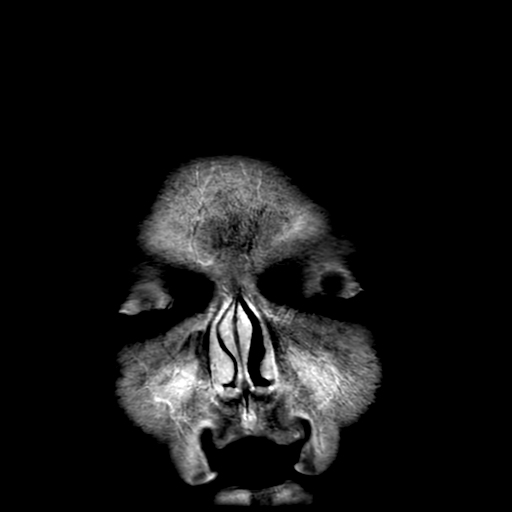

[Series 350: ADC · axial · 3.0mm · 0.94mm/px · z∈[-79,+72]mm · 2 of 52 slices shown (1 of 2)]
[im 1/52]
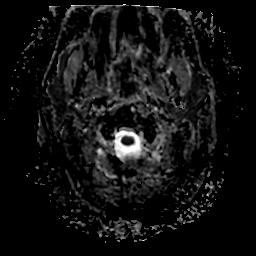
[im 52/52]
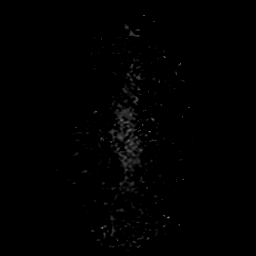

[Series 450: ADC · coronal · 4.0mm · 0.94mm/px · 1 of 37 slices shown (2 of 2)]
[im 1/37]
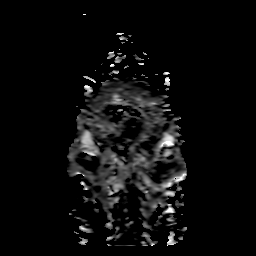

[17 of 48 positions shown; findings below may reference images not displayed]

FINDINGS: MRI HEAD FINDINGS

Brain: No acute infarction, hemorrhage, hydrocephalus, extra-axial
collection or mass lesion. Two or 3 tiny remote white matter
insults, age congruent. Normal brain volume

Vascular: Preserved flow voids and vascular enhancements

Skull and upper cervical spine: Normal marrow signal

Sinuses/Orbits: Fluid signal in the right petrous apex without
septal destruction, restricted diffusion, or T1 shortening, likely
trapped secretions. No superimposed inflammation seen on
postcontrast imaging.

MRA HEAD FINDINGS

Vessels are smooth and widely patent. Negative for aneurysm or signs
of vascular malformation. No noted atheromatous disease.

MRA NECK FINDINGS

Antegrade flow in the carotid and vertebral arteries by
time-of-flight technique.

No significant finding on neck mask.

Postcontrast neck MRA is limited by patient motion and bolus
diffusion. Patient was sleeping and catheter kinking are underlying
contributing factors per report. Evaluation of the vertebral
arteries is especially limited by intravenous plexus enhancement,
particularly on the left. There is cervical ICA tortuosity more
notable on the left. No evidence of major vessel stenosis, beading,
or dissection.
IMPRESSION: Brain MRI:

1. Negative brain.  No infarct or other explanation for symptoms.
2. Trapped secretions in the right petrous apex.

Intracranial and neck MRA:

Negative.

## 2020-11-18 MED ORDER — GADOBUTROL 1 MMOL/ML IV SOLN: 8.0000 mL | Freq: Once | INTRAVENOUS | Status: DC | PRN

## 2020-11-18 MED ORDER — GADOBUTROL 1 MMOL/ML IV SOLN
8.0000 mL | Freq: Once | INTRAVENOUS | Status: AC | PRN
  Administered 2020-11-18: 8 mL via INTRAVENOUS

## 2020-11-18 NOTE — ED Provider Notes (Signed)
  Provider Note MRN:  [id number]  Arrival date & time: 11/18/20    ED Course and Medical Decision Making  Assumed care from Dr. Clarice Pole upon patient transfer.  Heat exhaustion but with some right arm numbness, question TIA versus stroke, sent here for MRI imaging.  Patient feels generally tired but no longer has any lateralizing symptoms.  Suspect will be appropriate for discharge with reassuring MRI imaging.  MRI imaging is normal.  Discussed case with Dr. Amada Jupiter, who was involved in the transfer of the patient.  Patient is appropriate for discharge, recommending daily baby aspirin and follow-up with outpatient neuro.  Procedures  Final Clinical Impressions(s) / ED Diagnoses     ICD-10-CM   1. Right arm numbness  R20.0     2. Acute confusion  R41.0       ED Discharge Orders          Ordered    Ambulatory referral to Neurology       Comments: An appointment is requested in approximately: 4 weeks   11/18/20 0654              Discharge Instructions      You were evaluated in the Emergency Department and after careful evaluation, we did not find any emergent condition requiring admission or further testing in the hospital.  Your exam/testing today was overall reassuring.  MRI imaging was normal.  No evidence of stroke.  We recommend taking a daily 81 mg baby aspirin and following up with the neurology office provided.  Please return to the Emergency Department if you experience any worsening of your condition.  Thank you for allowing [place] to be a part of your care.       Elmer Sow. Pilar Plate, MD [facility] Emergency Medicine [facility] [email or web address]    Sabas Sous, MD 11/18/20 [phone]

## 2020-11-18 NOTE — Discharge Instructions (Addendum)
You were evaluated in the Emergency Department and after careful evaluation, we did not find any emergent condition requiring admission or further testing in the hospital.  Your exam/testing today was overall reassuring.  MRI imaging was normal.  No evidence of stroke.  We recommend taking a daily 81 mg baby aspirin and following up with the neurology office provided.  Please return to the Emergency Department if you experience any worsening of your condition.  Thank you for allowing [place] to be a part of your care.

## 2020-11-18 NOTE — ED Notes (Signed)
Returned from MRI 

## 2021-01-30 ENCOUNTER — Encounter: Payer: Self-pay | Admitting: Neurology

## 2021-01-30 ENCOUNTER — Ambulatory Visit: Payer: Self-pay | Admitting: Neurology

## 2021-07-24 ENCOUNTER — Encounter: Payer: Self-pay | Admitting: Obstetrics

## 2022-03-29 ENCOUNTER — Emergency Department (HOSPITAL_BASED_OUTPATIENT_CLINIC_OR_DEPARTMENT_OTHER): Payer: Self-pay

## 2022-03-29 ENCOUNTER — Emergency Department (HOSPITAL_BASED_OUTPATIENT_CLINIC_OR_DEPARTMENT_OTHER)
Admission: EM | Admit: 2022-03-29 | Discharge: 2022-03-29 | Disposition: A | Discharge status: Home / Self Care | Payer: Self-pay | Attending: Emergency Medicine | Admitting: Emergency Medicine

## 2022-03-29 ENCOUNTER — Other Ambulatory Visit: Payer: Self-pay

## 2022-03-29 ENCOUNTER — Encounter (HOSPITAL_BASED_OUTPATIENT_CLINIC_OR_DEPARTMENT_OTHER): Payer: Self-pay

## 2022-03-29 DIAGNOSIS — Z1152 Encounter for screening for COVID-19: Secondary | ICD-10-CM | POA: Insufficient documentation

## 2022-03-29 DIAGNOSIS — J101 Influenza due to other identified influenza virus with other respiratory manifestations: Secondary | ICD-10-CM | POA: Insufficient documentation

## 2022-03-29 LAB — CBC WITH DIFFERENTIAL/PLATELET
Abs Immature Granulocytes: 0.01 10*3/uL (ref 0.00–0.07)
Basophils Absolute: 0 10*3/uL (ref 0.0–0.1)
Basophils Relative: 0 %
Eosinophils Absolute: 0 10*3/uL (ref 0.0–0.5)
Eosinophils Relative: 0 %
HCT: 43.9 % (ref 36.0–46.0)
Hemoglobin: 15 g/dL (ref 12.0–15.0)
Immature Granulocytes: 0 %
Lymphocytes Relative: 15 %
Lymphs Abs: 0.7 10*3/uL (ref 0.7–4.0)
MCH: 32.7 pg (ref 26.0–34.0)
MCHC: 34.2 g/dL (ref 30.0–36.0)
MCV: 95.6 fL (ref 80.0–100.0)
Monocytes Absolute: 0.5 10*3/uL (ref 0.1–1.0)
Monocytes Relative: 11 %
Neutro Abs: 3.4 10*3/uL (ref 1.7–7.7)
Neutrophils Relative %: 74 %
Platelets: 302 10*3/uL (ref 150–400)
RBC: 4.59 MIL/uL (ref 3.87–5.11)
RDW: 12.1 % (ref 11.5–15.5)
WBC: 4.6 10*3/uL (ref 4.0–10.5)
nRBC: 0 % (ref 0.0–0.2)

## 2022-03-29 LAB — RESP PANEL BY RT-PCR (RSV, FLU A&B, COVID)  RVPGX2
Influenza A by PCR: POSITIVE — AB
Influenza B by PCR: NEGATIVE
Resp Syncytial Virus by PCR: NEGATIVE
SARS Coronavirus 2 by RT PCR: NEGATIVE

## 2022-03-29 LAB — BASIC METABOLIC PANEL
Anion gap: 10 (ref 5–15)
BUN: <5 mg/dL — ABNORMAL LOW (ref 6–20)
CO2: 25 mmol/L (ref 22–32)
Calcium: 9.1 mg/dL (ref 8.9–10.3)
Chloride: 102 mmol/L (ref 98–111)
Creatinine, Ser: 0.98 mg/dL (ref 0.44–1.00)
GFR, Estimated: >60 mL/min (ref >60)
Glucose, Bld: 138 mg/dL — ABNORMAL HIGH (ref 70–99)
Potassium: 3.3 mmol/L — ABNORMAL LOW (ref 3.5–5.1)
Sodium: 137 mmol/L (ref 135–145)

## 2022-03-29 LAB — TROPONIN I (HIGH SENSITIVITY): Troponin I (High Sensitivity): 7 ng/L (ref <18)

## 2022-03-29 MED ORDER — ONDANSETRON 4 MG PO TBDP
4.0000 mg | ORAL_TABLET | Freq: Once | ORAL | Status: AC
  Administered 2022-03-29: 4 mg via ORAL
  Filled 2022-03-29: qty 1

## 2022-03-29 MED ORDER — SODIUM CHLORIDE 0.9 % IV BOLUS
500.0000 mL | Freq: Once | INTRAVENOUS | Status: AC
  Administered 2022-03-29: 500 mL via INTRAVENOUS

## 2022-03-29 MED ORDER — ONDANSETRON HCL 4 MG PO TABS: 4.0000 mg | ORAL_TABLET | Freq: Three times a day (TID) | ORAL | 0 refills | Status: AC | PRN

## 2022-03-29 MED ORDER — SODIUM CHLORIDE 0.9 % IV BOLUS: 1000.0000 mL | Freq: Once | INTRAVENOUS | Status: DC

## 2022-03-29 NOTE — ED Notes (Signed)
ED Provider at bedside. 

## 2022-03-29 NOTE — ED Provider Notes (Signed)
[facility] EMERGENCY DEPARTMENT Provider Note   CSN: [id number] Arrival date & time: 03/29/22  1226     History  Chief Complaint  Patient presents with   Chest Pain    Victoria Stevenson is a 51 y.o. female who presents emergency department with concerns for chest tightness onset 5 days.  Has associated vomiting, watery nonbloody diarrhea, cough.  Has tried over-the-counter medications for her symptoms.  Was exposed to flu with her grandchildren who she took care of this week prior to onset of her symptoms.  Denies shortness of breath, abdominal pain.  Denies past medical history of MI, diabetes, hypertension, stents.  The history is provided by the patient. No language interpreter was used.       Home Medications Prior to Admission medications   Medication Sig Start Date End Date Taking? Authorizing Provider  ondansetron (ZOFRAN) 4 MG tablet Take 1 tablet (4 mg total) by mouth every 8 (eight) hours as needed for nausea or vomiting. 03/29/22  Yes Priyanka Causey A, PA-C  acetaminophen (TYLENOL) 500 MG tablet Take 500 mg by mouth every 6 (six) hours as needed for moderate pain or headache.    [provider]      Allergies    Hydrocodone-acetaminophen    Review of Systems   Review of Systems  Cardiovascular:  Positive for chest pain.  All other systems reviewed and are negative.   Physical Exam Updated Vital Signs BP (!) 135/99 (BP Location: Left Arm)   Pulse 83   Temp 98.2 F (36.8 C) (Oral)   Resp 19   Ht 5\' 6"  (1.676 m)   Wt 79.8 kg   LMP 03/25/2022   SpO2 100%   BMI 28.41 kg/m  Physical Exam Vitals and nursing note reviewed.  Constitutional:      General: She is not in acute distress.    Appearance: She is not diaphoretic.  HENT:     Head: Normocephalic and atraumatic.     Mouth/Throat:     Pharynx: No oropharyngeal exudate.  Eyes:     General: No scleral icterus.    Conjunctiva/sclera: Conjunctivae normal.  Cardiovascular:     Rate and  Rhythm: Normal rate and regular rhythm.     Pulses: Normal pulses.     Heart sounds: Normal heart sounds.  Pulmonary:     Effort: Pulmonary effort is normal. No respiratory distress.     Breath sounds: Normal breath sounds. No wheezing.  Abdominal:     General: Bowel sounds are normal.     Palpations: Abdomen is soft. There is no mass.     Tenderness: There is no abdominal tenderness. There is no guarding or rebound.  Musculoskeletal:        General: Normal range of motion.     Cervical back: Normal range of motion and neck supple.  Skin:    General: Skin is warm and dry.  Neurological:     Mental Status: She is alert.  Psychiatric:        Behavior: Behavior normal.     ED Results / Procedures / Treatments   Labs (all labs ordered are listed, but only abnormal results are displayed) Labs Reviewed  RESP PANEL BY RT-PCR (RSV, FLU A&B, COVID)  RVPGX2 - Abnormal; Notable for the following components:      Result Value   Influenza A by PCR POSITIVE (*)    All other components within normal limits  BASIC METABOLIC PANEL - Abnormal; Notable for the following components:  Potassium 3.3 (*)    Glucose, Bld 138 (*)    BUN <5 (*)    All other components within normal limits  CBC WITH DIFFERENTIAL/PLATELET  TROPONIN I (HIGH SENSITIVITY)  TROPONIN I (HIGH SENSITIVITY)    EKG EKG Interpretation  Date/Time:  Friday March 29 2022 12:47:23 EST Ventricular Rate:  70 PR Interval:  126 QRS Duration: 80 QT Interval:  396 QTC Calculation: 427 R Axis:   5 Text Interpretation: Normal sinus rhythm with sinus arrhythmia Confirmed by Virgina Norfolk ([id number]) on 03/29/2022 12:50:39 PM  Radiology DG Chest Portable 1 View  Result Date: 03/29/2022 CLINICAL DATA:  Cough, chest tightness. EXAM: PORTABLE CHEST 1 VIEW COMPARISON:  None Available. FINDINGS: The heart size and mediastinal contours are within normal limits. Both lungs are clear. The visualized skeletal structures are unremarkable.  IMPRESSION: No active disease. Electronically Signed   By: Larose Hires D.O.   On: 03/29/2022 12:58    Procedures Procedures    Medications Ordered in ED Medications  ondansetron (ZOFRAN-ODT) disintegrating tablet 4 mg (4 mg Oral Given 03/29/22 1243)  sodium chloride 0.9 % bolus 500 mL (500 mLs Intravenous New Bag/Given 03/29/22 1356)    ED Course/ Medical Decision Making/ A&P Clinical Course as of 03/29/22 1427  Fri Mar 29, 2022  1348 Influenza A By PCR(!): POSITIVE Discussed with patient and spouse regarding positive exam findings of flu. [SB]  1421 Discussed with patient lab and imaging findings.  Offered to give patient albuterol inhaler to help with her chest tightness sensation, patient declines at the time and notes "I do not like the way that it speeds up my heart".  Also offered to send patient home with Tessalon pearls, patient declines at this time.  Answered all available questions.  Patient appears safe for discharge at this time. [SB]    Clinical Course User Index [SB] Betsabe Iglesia A, PA-C                           Medical Decision Making Amount and/or Complexity of Data Reviewed Labs: ordered. Decision-making details documented in ED Course. Radiology: ordered.  Risk Prescription drug management.   Patient presents to the ED with concerns for chest tightness onset 5 days.  No sick contacts at home with flu.  Denies cardiac history.  Patient afebrile.  On exam patient without chest wall tenderness to palpation.  No acute cardiovascular respiratory exam findings.  Differential diagnosis includes ACS, pneumothorax, pneumonia, COVID, flu, RSV.    Labs:  I ordered, and personally interpreted labs.  The pertinent results include:   Initial troponin is 7 CBC unremarkable BMP with slightly decreased potassium at 3.3 otherwise unremarkable COVID, RSV negative, positive for influenza A  Imaging: I ordered imaging studies including CXR I independently visualized and  interpreted imaging which showed: No acute findings I agree with the radiologist interpretation  Medications:  I ordered medication including Zofran, IV fluids for symptom management Reevaluation of the patient after these medicines and interventions, I reevaluated the patient and found that they have improved I have reviewed the patients home medicines and have made adjustments as needed   Disposition: Presentation suspicious for influenza A.  Doubt COVID, pneumonia, RSV.  EKG without acute ST/T changes, troponins negative, chest x-ray negative, low suspicion for ACS at this time. Chest x-ray without acute findings, vital signs stable, doubt aortic dissection or pneumothorax at this time. Heart score, patient at low risk. Case discussed with attending who  agrees with discharge treatment plan at this time. After consideration of the diagnostic results and the patients response to treatment, I feel that the patient would benefit from Discharge home.  Patient sent with a prescription for Zofran.  Supportive care measures and strict return precautions discussed with patient at bedside. Pt acknowledges and verbalizes understanding. Pt appears safe for discharge. Follow up as indicated in discharge paperwork.   This chart was dictated using voice recognition software, Dragon. Despite the best efforts of this provider to proofread and correct errors, errors may still occur which can change documentation meaning.   Final Clinical Impression(s) / ED Diagnoses Final diagnoses:  Influenza A    Rx / DC Orders ED Discharge Orders          Ordered    ondansetron (ZOFRAN) 4 MG tablet  Every 8 hours PRN        03/29/22 1426              Crystal Ellwood A, PA-C 03/29/22 1428    Curatolo, Adam, DO 03/29/22 1441

## 2022-03-29 NOTE — Discharge Instructions (Addendum)
It was a pleasure taking care of you today!   Your COVID and RSV swabs were negative today. Your chest xray didn't show concerning findings for pneumonia today. Your flu swab was positive for Flu A. You may continue with over the counter cough and cold medications. Ensure to maintain fluid intake.  You will be sent a prescription for Zofran to take as needed for nausea or vomiting.  If you feel that you need to, you may use your albuterol inhaler at home with 2 puffs every 4 hours.  Follow up with your primary care provider regarding todays ED visit. Ensure that you are wearing your mask and practicing good hand hygiene. Return to the ED if you are experiencing increasing/worsening symptoms.

## 2022-03-29 NOTE — ED Triage Notes (Signed)
Pt reports chest tightness and dry mouth since monday. Vomiting and diarrhea as well. Pt has been exposed to flu with grandchildren

## 2022-12-18 ENCOUNTER — Encounter (HOSPITAL_BASED_OUTPATIENT_CLINIC_OR_DEPARTMENT_OTHER): Payer: Self-pay | Admitting: Emergency Medicine

## 2022-12-18 ENCOUNTER — Emergency Department (HOSPITAL_BASED_OUTPATIENT_CLINIC_OR_DEPARTMENT_OTHER): Payer: Self-pay

## 2022-12-18 ENCOUNTER — Emergency Department (HOSPITAL_BASED_OUTPATIENT_CLINIC_OR_DEPARTMENT_OTHER)
Admission: EM | Admit: 2022-12-18 | Discharge: 2022-12-18 | Disposition: A | Discharge status: Home / Self Care | Payer: Self-pay | Attending: Emergency Medicine | Admitting: Emergency Medicine

## 2022-12-18 ENCOUNTER — Other Ambulatory Visit: Payer: Self-pay

## 2022-12-18 DIAGNOSIS — R112 Nausea with vomiting, unspecified: Secondary | ICD-10-CM | POA: Insufficient documentation

## 2022-12-18 DIAGNOSIS — R42 Dizziness and giddiness: Secondary | ICD-10-CM

## 2022-12-18 DIAGNOSIS — D649 Anemia, unspecified: Secondary | ICD-10-CM | POA: Insufficient documentation

## 2022-12-18 LAB — BASIC METABOLIC PANEL
Anion gap: 8 (ref 5–15)
BUN: 7 mg/dL (ref 6–20)
CO2: 23 mmol/L (ref 22–32)
Calcium: 8.5 mg/dL — ABNORMAL LOW (ref 8.9–10.3)
Chloride: 104 mmol/L (ref 98–111)
Creatinine, Ser: 0.78 mg/dL (ref 0.44–1.00)
GFR, Estimated: >60 mL/min (ref >60)
Glucose, Bld: 110 mg/dL — ABNORMAL HIGH (ref 70–99)
Potassium: 3.5 mmol/L (ref 3.5–5.1)
Sodium: 135 mmol/L (ref 135–145)

## 2022-12-18 LAB — CBC WITH DIFFERENTIAL/PLATELET
Abs Immature Granulocytes: 0.02 10*3/uL (ref 0.00–0.07)
Basophils Absolute: 0 10*3/uL (ref 0.0–0.1)
Basophils Relative: 0 %
Eosinophils Absolute: 0.1 10*3/uL (ref 0.0–0.5)
Eosinophils Relative: 1 %
HCT: 30.4 % — ABNORMAL LOW (ref 36.0–46.0)
Hemoglobin: 10.2 g/dL — ABNORMAL LOW (ref 12.0–15.0)
Immature Granulocytes: 0 %
Lymphocytes Relative: 16 %
Lymphs Abs: 1.4 10*3/uL (ref 0.7–4.0)
MCH: 32.1 pg (ref 26.0–34.0)
MCHC: 33.6 g/dL (ref 30.0–36.0)
MCV: 95.6 fL (ref 80.0–100.0)
Monocytes Absolute: 0.5 10*3/uL (ref 0.1–1.0)
Monocytes Relative: 5 %
Neutro Abs: 7 10*3/uL (ref 1.7–7.7)
Neutrophils Relative %: 78 %
Platelets: 372 10*3/uL (ref 150–400)
RBC: 3.18 MIL/uL — ABNORMAL LOW (ref 3.87–5.11)
RDW: 12.9 % (ref 11.5–15.5)
WBC: 9.1 10*3/uL (ref 4.0–10.5)
nRBC: 0 % (ref 0.0–0.2)

## 2022-12-18 LAB — TROPONIN I (HIGH SENSITIVITY)
Troponin I (High Sensitivity): 2 ng/L (ref <18)
Troponin I (High Sensitivity): 3 ng/L (ref <18)

## 2022-12-18 LAB — OCCULT BLOOD X 1 CARD TO LAB, STOOL: Fecal Occult Bld: POSITIVE — AB

## 2022-12-18 MED ORDER — ONDANSETRON HCL 4 MG/2ML IJ SOLN
4.0000 mg | Freq: Once | INTRAMUSCULAR | Status: AC
  Administered 2022-12-18: 4 mg via INTRAVENOUS
  Filled 2022-12-18: qty 2

## 2022-12-18 MED ORDER — MECLIZINE HCL 25 MG PO TABS
25.0000 mg | ORAL_TABLET | Freq: Once | ORAL | Status: AC
  Administered 2022-12-18: 25 mg via ORAL
  Filled 2022-12-18: qty 1

## 2022-12-18 MED ORDER — ONDANSETRON 4 MG PO TBDP: 4.0000 mg | ORAL_TABLET | Freq: Three times a day (TID) | ORAL | 0 refills | Status: DC | PRN

## 2022-12-18 MED ORDER — MECLIZINE HCL 25 MG PO TABS: 25.0000 mg | ORAL_TABLET | Freq: Three times a day (TID) | ORAL | 0 refills | Status: AC | PRN

## 2022-12-18 MED ORDER — IOHEXOL 350 MG/ML SOLN
75.0000 mL | Freq: Once | INTRAVENOUS | Status: AC | PRN
  Administered 2022-12-18: 75 mL via INTRAVENOUS

## 2022-12-18 MED ORDER — SODIUM CHLORIDE 0.9 % IV BOLUS
1000.0000 mL | Freq: Once | INTRAVENOUS | Status: AC
  Administered 2022-12-18: 1000 mL via INTRAVENOUS

## 2022-12-18 NOTE — ED Notes (Addendum)
Pt was able to tolerate gatorade fluid challenge with no nausea or vomiting

## 2022-12-18 NOTE — ED Provider Notes (Signed)
[place] EMERGENCY DEPARTMENT AT [facility] Provider Note   CSN: [id number] Arrival date & time: 12/18/22  0150     History  Chief Complaint  Patient presents with   Dizziness   Emesis    Victoria Stevenson is a 52 y.o. female.  Patient here with room spinning dizziness, nausea and vomiting.  States she felt well going to bed.  She woke up and turned in bed and suddenly began to feel dizzy with room spinning.  Tried to take meclizine for vertigo which she has had in the past and threw that up.  Symptoms worse with turning her head.  Has vomited a total of 3 times.  Denies headache.  Denies vision changes.  Denies chest pain or shortness of breath.  No focal weakness, numbness or tingling.  No neck pain or back pain.  States she had an earbud stuck in her ear last year and that is the only other time she has had this dizziness. Felt well prior to going to bed last night. Symptoms worse when she turns her head and tries to move around.  No abdominal pain or chest pain.  The history is provided by the patient.  Dizziness Associated symptoms: nausea and vomiting   Associated symptoms: no headaches, no shortness of breath and no weakness   Emesis Associated symptoms: no abdominal pain, no arthralgias, no fever, no headaches and no myalgias        Home Medications Prior to Admission medications   Medication Sig Start Date End Date Taking? Authorizing Provider  acetaminophen (TYLENOL) 500 MG tablet Take 500 mg by mouth every 6 (six) hours as needed for moderate pain or headache.    [provider]  ondansetron (ZOFRAN) 4 MG tablet Take 1 tablet (4 mg total) by mouth every 8 (eight) hours as needed for nausea or vomiting. 03/29/22   Blue, Soijett A, PA-C      Allergies    Hydrocodone-acetaminophen    Review of Systems   Review of Systems  Constitutional:  Negative for activity change, appetite change and fever.  HENT:  Negative for congestion and rhinorrhea.    Respiratory:  Negative for chest tightness and shortness of breath.   Gastrointestinal:  Positive for nausea and vomiting. Negative for abdominal pain.  Genitourinary:  Negative for dysuria and hematuria.  Musculoskeletal:  Negative for arthralgias and myalgias.  Skin:  Negative for rash.  Neurological:  Positive for dizziness and light-headedness. Negative for weakness and headaches.   all other systems are negative except as noted in the HPI and PMH.    Physical Exam Updated Vital Signs BP (!) 136/99 (BP Location: Right Arm)   Pulse (!) 56   Temp 98.2 F (36.8 C) (Oral)   Resp 18   Ht 5\' 7"  (1.702 m)   Wt 74.8 kg   LMP  (LMP Unknown)   SpO2 100%   BMI 25.84 kg/m  Physical Exam Vitals and nursing note reviewed.  Constitutional:      General: She is not in acute distress.    Appearance: She is well-developed.  HENT:     Head: Normocephalic and atraumatic.     Mouth/Throat:     Pharynx: No oropharyngeal exudate.  Eyes:     Conjunctiva/sclera: Conjunctivae normal.     Pupils: Pupils are equal, round, and reactive to light.  Neck:     Comments: No meningismus. Cardiovascular:     Rate and Rhythm: Normal rate and regular rhythm.  Heart sounds: Normal heart sounds. No murmur heard. Pulmonary:     Effort: Pulmonary effort is normal. No respiratory distress.     Breath sounds: Normal breath sounds.  Abdominal:     Palpations: Abdomen is soft.     Tenderness: There is no abdominal tenderness. There is no guarding or rebound.  Genitourinary:    Comments: Chaperone present Robin NT.  No gross blood.  No hemorrhoids or fissures seen. Musculoskeletal:        General: No tenderness. Normal range of motion.     Cervical back: Normal range of motion and neck supple.  Skin:    General: Skin is warm.  Neurological:     Mental Status: She is alert and oriented to person, place, and time.     Cranial Nerves: No cranial nerve deficit.     Motor: No abnormal muscle tone.      Coordination: Coordination normal.     Comments: CN 2-12 intact, no ataxia on finger to nose, no nystagmus, 5/5 strength throughout, no pronator drift, Romberg negative, normal gait.  No appreciable nystagmus.  No ataxia on finger-to-nose.  Head impulse testing negative.  Test of skew negative.  Psychiatric:        Behavior: Behavior normal.     ED Results / Procedures / Treatments   Labs (all labs ordered are listed, but only abnormal results are displayed) Labs Reviewed  CBC WITH DIFFERENTIAL/PLATELET - Abnormal; Notable for the following components:      Result Value   RBC 3.18 (*)    Hemoglobin 10.2 (*)    HCT 30.4 (*)    All other components within normal limits  BASIC METABOLIC PANEL - Abnormal; Notable for the following components:   Glucose, Bld 110 (*)    Calcium 8.5 (*)    All other components within normal limits  OCCULT BLOOD X 1 CARD TO LAB, STOOL - Abnormal; Notable for the following components:   Fecal Occult Bld POSITIVE (*)    All other components within normal limits  TROPONIN I (HIGH SENSITIVITY)  TROPONIN I (HIGH SENSITIVITY)    EKG EKG Interpretation Date/Time:  Wednesday December 18 2022 04:23:31 EDT Ventricular Rate:  51 PR Interval:  152 QRS Duration:  90 QT Interval:  444 QTC Calculation: 409 R Axis:   29  Text Interpretation: Sinus rhythm No significant change was found ST changes resolved. likely artifactual Confirmed by Glynn Octave [phone]) on 12/18/2022 4:27:10 AM  Radiology CT ANGIO HEAD NECK W WO CM  Result Date: 12/18/2022 CLINICAL DATA:  Central vertigo. Room spinning when going to use the bathroom. EXAM: CT ANGIOGRAPHY HEAD AND NECK WITH AND WITHOUT CONTRAST TECHNIQUE: Multidetector CT imaging of the head and neck was performed using the standard protocol during bolus administration of intravenous contrast. Multiplanar CT image reconstructions and MIPs were obtained to evaluate the vascular anatomy. Carotid stenosis measurements (when  applicable) are obtained utilizing NASCET criteria, using the distal internal carotid diameter as the denominator. RADIATION DOSE REDUCTION: This exam was performed according to the departmental dose-optimization program which includes automated exposure control, adjustment of the mA and/or kV according to patient size and/or use of iterative reconstruction technique. CONTRAST:  75mL OMNIPAQUE IOHEXOL 350 MG/ML SOLN COMPARISON:  Brain MRI and MRA 11/18/2020 FINDINGS: CT HEAD FINDINGS Brain: No evidence of acute infarction, hemorrhage, hydrocephalus, extra-axial collection or mass lesion/mass effect. Vascular: No hyperdense vessel or unexpected calcification. Skull: Normal. Negative for fracture or focal lesion. Sinuses/Orbits: Trapped secretions at the right petrous  apex, chronic when compared to brain MRI. No progression or erosion to correlate with the history. Review of the MIP images confirms the above findings CTA NECK FINDINGS Aortic arch: Not covered. Right carotid system: Vessels are smoothly contoured and widely patent Left carotid system: Vessels are smoothly contoured and widely patent. ICA tortuosity below the skull base. Vertebral arteries: No proximal subclavian stenosis. The vertebral arteries are smoothly contoured and diffusely patent. Skeleton: No acute finding Other neck: Negative Upper chest: Clear apical lungs Review of the MIP images confirms the above findings CTA HEAD FINDINGS Anterior circulation: No significant stenosis, proximal occlusion, aneurysm, or vascular malformation. Posterior circulation: No significant stenosis, proximal occlusion, aneurysm, or vascular malformation. Venous sinuses: As permitted by contrast timing, patent. Anatomic variants: None significant Review of the MIP images confirms the above findings IMPRESSION: Negative CTA.  No explanation for symptoms. Electronically Signed   By: Tiburcio Pea M.D.   On: 12/18/2022 04:19    Procedures Procedures     Medications Ordered in ED Medications  sodium chloride 0.9 % bolus 1,000 mL (has no administration in time range)  meclizine (ANTIVERT) tablet 25 mg (has no administration in time range)  ondansetron (ZOFRAN) injection 4 mg (has no administration in time range)    ED Course/ Medical Decision Making/ A&P                                 Medical Decision Making Amount and/or Complexity of Data Reviewed Labs: ordered. Decision-making details documented in ED Course. Radiology: ordered and independent interpretation performed. Decision-making details documented in ED Course. ECG/medicine tests: ordered and independent interpretation performed. Decision-making details documented in ED Course.  Risk Prescription drug management.  Sudden onset of room spinning dizziness with nausea and vomiting.  No chest pain.  No nystagmus or ataxia on exam.  Suspect likely peripheral vertigo.  CVA less likely.  Orthostatics are negative. Suspect likely peripheral vertigo.  Nothing to suggest CVA.  No nystagmus.  No ataxia.  Normal gait with negative Romberg.  CTA negative for large vessel occlusion, vertebral or basilar artery stenosis.  Hemoglobin has decreased to 10.2 from 15 last year. Was in 13-14 range previously.  Patient reports she has had ongoing vaginal bleeding for the past "8 months" requiring multiple pads per day.  She has a appointment with her doctor regarding this later this month.  Denies any abdominal pain.  She is concerned she is going through menopause. Denies any black or bloody stools.  Suspect her anemia is likely secondary to vaginal blood losses. Do not suspect her anemia is contributing to her vertigo and dizziness symptoms today.  Feels improved on recheck.  Tolerating p.o. and ambulatory.  Dizziness has resolved.  No chest pain or shortness of breath.  Treat for suspected peripheral vertigo with meclizine and Zofran.  Follow-up with PCP.  Refer to gynecology as well  given her ongoing vaginal bleeding and anemia.  GI referral given as well though she denies any black or bloody stools.  Hemoccult is incidentally positive today but no gross blood was seen.  Suspect this is likely contamination from vaginal source.  Vital signs remained stable.  No tachycardia or hypotension.  No indication for blood transfusion today. Follow-up with PCP as well as gynecology and gastroenterology.  Return precautions discussed.        Final Clinical Impression(s) / ED Diagnoses Final diagnoses:  Vertigo  Anemia, unspecified type  Rx / DC Orders ED Discharge Orders     None         Sy Saintjean, Jeannett Senior, MD 12/18/22 [phone]

## 2022-12-18 NOTE — ED Triage Notes (Signed)
Pt states woke up to use bathroom just before Midnight, state room was spinning, to meds for vertigo and threw it up. Sx is worse when turning head from left to right.

## 2022-12-18 NOTE — ED Notes (Signed)
Pt wasable to tolerate ambulation around ED floor with no dizziness or nausea

## 2022-12-18 NOTE — Discharge Instructions (Addendum)
Your testing is reassuring.  No evidence of stroke or blocked blood vessel in your brain.  You do have anemia likely secondary to your vaginal bleeding. Follow-up with the gynecologist as above for further evaluation of this and pelvic ultrasound.  You should follow-up with the gastroenterologist as well to determine if your blood is being lost in your stool as well. You do not need an ultrasound or blood transfusion today but it is important to be seen within the next week. Return to the ED for reevaluation if you are unable to be seen.  Return to the ED sooner with chest pain, shortness of breath, worsening bleeding, dizziness, lightheadedness or other concerns.

## 2022-12-18 NOTE — ED Notes (Signed)
Pt states she is very sleepy from medication and requested to wait on ambulation

## 2023-03-11 ENCOUNTER — Emergency Department (HOSPITAL_BASED_OUTPATIENT_CLINIC_OR_DEPARTMENT_OTHER): Payer: Self-pay

## 2023-03-11 ENCOUNTER — Emergency Department (HOSPITAL_BASED_OUTPATIENT_CLINIC_OR_DEPARTMENT_OTHER)
Admission: EM | Admit: 2023-03-11 | Discharge: 2023-03-11 | Disposition: A | Discharge status: Home / Self Care | Payer: Self-pay | Attending: Emergency Medicine | Admitting: Emergency Medicine

## 2023-03-11 ENCOUNTER — Other Ambulatory Visit: Payer: Self-pay

## 2023-03-11 ENCOUNTER — Encounter (HOSPITAL_BASED_OUTPATIENT_CLINIC_OR_DEPARTMENT_OTHER): Payer: Self-pay | Admitting: Urology

## 2023-03-11 DIAGNOSIS — R0789 Other chest pain: Secondary | ICD-10-CM | POA: Insufficient documentation

## 2023-03-11 DIAGNOSIS — N838 Other noninflammatory disorders of ovary, fallopian tube and broad ligament: Secondary | ICD-10-CM

## 2023-03-11 DIAGNOSIS — R079 Chest pain, unspecified: Secondary | ICD-10-CM

## 2023-03-11 DIAGNOSIS — R1084 Generalized abdominal pain: Secondary | ICD-10-CM

## 2023-03-11 LAB — POCT I-STAT, CHEM 8
BUN: 4 mg/dL — ABNORMAL LOW (ref 6–20)
Calcium, Ion: 1.13 mmol/L — ABNORMAL LOW (ref 1.15–1.40)
Chloride: 101 mmol/L (ref 98–111)
Creatinine, Ser: 0.8 mg/dL (ref 0.44–1.00)
Glucose, Bld: 125 mg/dL — ABNORMAL HIGH (ref 70–99)
HCT: 36 % (ref 36.0–46.0)
Hemoglobin: 12.2 g/dL (ref 12.0–15.0)
Potassium: 2.9 mmol/L — ABNORMAL LOW (ref 3.5–5.1)
Sodium: 137 mmol/L (ref 135–145)
TCO2: 24 mmol/L (ref 22–32)

## 2023-03-11 LAB — TROPONIN I (HIGH SENSITIVITY)
Troponin I (High Sensitivity): 2 ng/L (ref <18)
Troponin I (High Sensitivity): 3 ng/L (ref <18)

## 2023-03-11 LAB — HEPATIC FUNCTION PANEL
ALT: 13 U/L (ref 0–44)
AST: 20 U/L (ref 15–41)
Albumin: 4.3 g/dL (ref 3.5–5.0)
Alkaline Phosphatase: 74 U/L (ref 38–126)
Bilirubin, Direct: 0.1 mg/dL (ref 0.0–0.2)
Indirect Bilirubin: 0.4 mg/dL (ref 0.3–0.9)
Total Bilirubin: 0.5 mg/dL (ref <1.2)
Total Protein: 8.1 g/dL (ref 6.5–8.1)

## 2023-03-11 LAB — LIPASE, BLOOD: Lipase: 25 U/L (ref 11–51)

## 2023-03-11 LAB — CBC
HCT: 33.1 % — ABNORMAL LOW (ref 36.0–46.0)
Hemoglobin: 10.9 g/dL — ABNORMAL LOW (ref 12.0–15.0)
MCH: 30 pg (ref 26.0–34.0)
MCHC: 32.9 g/dL (ref 30.0–36.0)
MCV: 91.2 fL (ref 80.0–100.0)
Platelets: 414 10*3/uL — ABNORMAL HIGH (ref 150–400)
RBC: 3.63 MIL/uL — ABNORMAL LOW (ref 3.87–5.11)
RDW: 14.2 % (ref 11.5–15.5)
WBC: 8 10*3/uL (ref 4.0–10.5)
nRBC: 0 % (ref 0.0–0.2)

## 2023-03-11 LAB — PREGNANCY, URINE: Preg Test, Ur: NEGATIVE

## 2023-03-11 MED ORDER — ONDANSETRON HCL 4 MG/2ML IJ SOLN
4.0000 mg | Freq: Once | INTRAMUSCULAR | Status: AC
  Administered 2023-03-11: 4 mg via INTRAVENOUS
  Filled 2023-03-11: qty 2

## 2023-03-11 MED ORDER — OXYCODONE-ACETAMINOPHEN 5-325 MG PO TABS: 1.0000 | ORAL_TABLET | Freq: Four times a day (QID) | ORAL | 0 refills | Status: AC | PRN

## 2023-03-11 MED ORDER — SODIUM CHLORIDE 0.9 % IV BOLUS
500.0000 mL | Freq: Once | INTRAVENOUS | Status: AC
  Administered 2023-03-11: 500 mL via INTRAVENOUS

## 2023-03-11 MED ORDER — ONDANSETRON 4 MG PO TBDP: 4.0000 mg | ORAL_TABLET | Freq: Three times a day (TID) | ORAL | 0 refills | Status: AC | PRN

## 2023-03-11 MED ORDER — MORPHINE SULFATE (PF) 4 MG/ML IV SOLN
4.0000 mg | Freq: Once | INTRAVENOUS | Status: AC
  Administered 2023-03-11: 4 mg via INTRAVENOUS
  Filled 2023-03-11: qty 1

## 2023-03-11 MED ORDER — SENNOSIDES-DOCUSATE SODIUM 8.6-50 MG PO TABS: 1.0000 | ORAL_TABLET | Freq: Every evening | ORAL | 0 refills | Status: AC | PRN

## 2023-03-11 MED ORDER — OXYCODONE-ACETAMINOPHEN 5-325 MG PO TABS
1.0000 | ORAL_TABLET | Freq: Once | ORAL | Status: AC
  Administered 2023-03-11: 1 via ORAL
  Filled 2023-03-11: qty 1

## 2023-03-11 MED ORDER — IOHEXOL 300 MG/ML  SOLN
100.0000 mL | Freq: Once | INTRAMUSCULAR | Status: AC | PRN
  Administered 2023-03-11: 100 mL via INTRAVENOUS

## 2023-03-11 NOTE — ED Triage Notes (Signed)
Pt states chest tightness, dry mouth, and dizziness that started this am and got worse throughout the day  States slight SOB as well  Took "tsp of cayanne pepper" per pt with no relief    Had cervical biopsy yesterday No cardiac history

## 2023-03-11 NOTE — ED Notes (Signed)
Pt has returned to their room

## 2023-03-11 NOTE — ED Notes (Signed)
Patient transported to X-ray 

## 2023-03-11 NOTE — ED Provider Notes (Signed)
Emergency Department Provider Note   I have reviewed the triage vital signs and the nursing notes.   HISTORY  Chief Complaint Chest Pain   HPI Victoria Stevenson is a 52 y.o. female presents today department with chest tightness and bandlike abdominal fullness.  Patient tells me that she has had several weeks of similar symptoms but pain symptoms have worsened in the past 24 hours.  She had an outpatient cervical biopsy in the office yesterday.  This did not require general anesthesia.  She had mild bleeding after the procedure but nothing heavy since.  No discharge.  No dysuria, hesitancy, urgency.  She states she was seen in September for vertigo but also having emesis symptoms.  She states around that time she developed the abdominal discomfort and loose stools.  No blood. No recent abx.    History reviewed. No pertinent past medical history.  Review of Systems  Constitutional: No fever/chills Cardiovascular: Positive chest pain. Respiratory: Denies shortness of breath. Gastrointestinal: Positive abdominal pain.  No nausea, no vomiting.  Genitourinary: Negative for dysuria. Musculoskeletal: Negative for back pain. Skin: Negative for rash. Neurological: Negative for headaches.   ____________________________________________   PHYSICAL EXAM:  VITAL SIGNS: ED Triage Vitals  Encounter Vitals Group     BP 03/11/23 1802 (!) 160/97     Pulse Rate 03/11/23 1802 92     Resp 03/11/23 1802 18     Temp 03/11/23 1802 97.9 F (36.6 C)     Temp src --      SpO2 03/11/23 1802 100 %     Weight 03/11/23 1802 181 lb (82.1 kg)     Height 03/11/23 1802 5\' 7"  (1.702 m)   Constitutional: Alert and oriented. Well appearing and in no acute distress. Eyes: Conjunctivae are normal.  Head: Atraumatic. Nose: No congestion/rhinnorhea. Mouth/Throat: Mucous membranes are moist.  Neck: No stridor.   Cardiovascular: Normal rate, regular rhythm. Good peripheral circulation. Grossly normal heart  sounds.   Respiratory: Normal respiratory effort.  No retractions. Lungs CTAB. Gastrointestinal: Soft and nontender. No distention.  Musculoskeletal: No lower extremity tenderness nor edema. No gross deformities of extremities. Neurologic:  Normal speech and language. No gross focal neurologic deficits are appreciated.  Skin:  Skin is warm, dry and intact. No rash noted.  ____________________________________________   LABS (all labs ordered are listed, but only abnormal results are displayed)  Labs Reviewed  CBC - Abnormal; Notable for the following components:      Result Value   RBC 3.63 (*)    Hemoglobin 10.9 (*)    HCT 33.1 (*)    Platelets 414 (*)    All other components within normal limits  POCT I-STAT, CHEM 8 - Abnormal; Notable for the following components:   Potassium 2.9 (*)    BUN 4 (*)    Glucose, Bld 125 (*)    Calcium, Ion 1.13 (*)    All other components within normal limits  PREGNANCY, URINE  HEPATIC FUNCTION PANEL  LIPASE, BLOOD  I-STAT CHEM 8, ED  TROPONIN I (HIGH SENSITIVITY)  TROPONIN I (HIGH SENSITIVITY)   ____________________________________________  EKG   EKG Interpretation Date/Time:  Tuesday March 11 2023 18:05:28 EST Ventricular Rate:  69 PR Interval:  140 QRS Duration:  80 QT Interval:  408 QTC Calculation: 437 R Axis:   2  Text Interpretation: Normal sinus rhythm Normal ECG When compared with ECG of 18-Dec-2022 04:23, PREVIOUS ECG IS PRESENT Confirmed by Alona Bene [phone]) on 03/11/2023 6:07:31 PM  ____________________________________________  RADIOLOGY  US PELVIC COMPLETE W TRANSVAGINAL AND TORSION R/O  Result Date: 03/11/2023 CLINICAL DATA:  Pelvic mass pain and vaginal bleeding EXAM: TRANSABDOMINAL AND TRANSVAGINAL ULTRASOUND OF PELVIS DOPPLER ULTRASOUND OF OVARIES TECHNIQUE: Both transabdominal and transvaginal ultrasound examinations of the pelvis were performed. Transabdominal technique was performed for global  imaging of the pelvis including uterus, ovaries, adnexal regions, and pelvic cul-de-sac. It was necessary to proceed with endovaginal exam following the transabdominal exam to visualize the uterus endometrium ovaries. Color and duplex Doppler ultrasound was utilized to evaluate blood flow to the ovaries. COMPARISON:  CT 03/11/2023 FINDINGS: Uterus Measurements: 11.4 x 5.8 x 6.1 cm = volume: 209 mL. No fibroids or other mass visualized. Multiple nabothian cysts Endometrium Thickness: 13.4 mm.  Heterogenous with tiny cystic spaces. Right ovary Not visualized Left ovary Large solid appearing left adnexal mass, this measures 8.5 x 4.9 x 8.6 cm. Pulsed Doppler evaluation of the left ovarian mass lesion demonstrate normal low-resistance arterial and venous waveforms. Other findings No abnormal free fluid. IMPRESSION: 1. Nonvisualized right ovary. 2. 8.6 cm solid left adnexal mass. A normal left ovary is not visualized and could be entirely replaced by solid left ovarian mass. Imaging appearance is nonspecific and could relate to thecoma or potential fibroma but surgical consultation is recommended. 3. Slight heterogenous endometrial thickening which may be correlated with the patient's recent biopsy results. Electronically Signed   By: Jasmine Pang M.D.   On: 03/11/2023 23:03   CT ABDOMEN PELVIS W CONTRAST  Result Date: 03/11/2023 CLINICAL DATA:  Abdominal pain, acute, nonlocalized. EXAM: CT ABDOMEN AND PELVIS WITH CONTRAST TECHNIQUE: Multidetector CT imaging of the abdomen and pelvis was performed using the standard protocol following bolus administration of intravenous contrast. RADIATION DOSE REDUCTION: This exam was performed according to the departmental dose-optimization program which includes automated exposure control, adjustment of the mA and/or kV according to patient size and/or use of iterative reconstruction technique. CONTRAST:  OMNIPAQUE IOHEXOL 300 MG/ML  SOLN COMPARISON:  None Available.  FINDINGS: Lower chest: Clear lung bases. No significant pleural or pericardial effusion. Hepatobiliary: The liver is normal in density without suspicious focal abnormality. No evidence of gallstones, gallbladder wall thickening or biliary dilatation. Pancreas: Unremarkable. No pancreatic ductal dilatation or surrounding inflammatory changes. Spleen: Normal in size without focal abnormality. Adrenals/Urinary Tract: Both adrenal glands appear normal. No evidence of urinary tract calculus, suspicious renal lesion or hydronephrosis. The bladder appears unremarkable for its degree of distention. Stomach/Bowel: No enteric contrast administered. The stomach appears unremarkable for its degree of distension. No evidence of bowel wall thickening, distention or surrounding inflammatory change. The appendix appears normal. Vascular/Lymphatic: There are no enlarged abdominal or pelvic lymph nodes. No significant vascular findings. Reproductive: Nonspecific mild thickening of the endometrium to 1.3 cm. There are several cervical nabothian cysts. Posteriorly in the left adnexa, there is a large mildly complex mass which measures 9.6 x 5.1 x 5.9 cm. This is well-circumscribed with intermediate slightly heterogeneous density. There are small peripheral vessels associated with this lesion which appears solid. There are no associated calcifications, fatty components or surrounding inflammatory changes. This abuts the uterus and is contiguous with the left gonadal vessels. A normal left ovary is not seen separate from this. No right adnexal abnormality. Other: No evidence of abdominal wall mass or hernia. No ascites or pneumoperitoneum. Musculoskeletal: No acute or significant osseous findings. IMPRESSION: 1. Large mildly complex mass in the left adnexa as described. This is indeterminate, but could reflect a large ovarian  fibroma/fibrothecoma or endometrioma. Exophytic uterine fibroid considered less likely. No surrounding  inflammatory changes to suggest ovarian torsion. Recommend further evaluation with pelvic ultrasound. 2. Nonspecific mild thickening of the endometrium, potentially physiologic. 3. No other acute findings or explanation for the patient's symptoms. Electronically Signed   By: Carey Bullocks M.D.   On: 03/11/2023 20:20   DG Chest 2 View  Result Date: 03/11/2023 CLINICAL DATA:  Chest pain. EXAM: CHEST - 2 VIEW COMPARISON:  03/29/2022. FINDINGS: Bilateral lung fields are clear. Bilateral costophrenic angles are clear. Normal cardio-mediastinal silhouette. No acute osseous abnormalities. The soft tissues are within normal limits. IMPRESSION: *No active cardiopulmonary disease. Electronically Signed   By: Jules Schick M.D.   On: 03/11/2023 18:53    ____________________________________________   PROCEDURES  Procedure(s) performed:   Procedures  None  ____________________________________________   INITIAL IMPRESSION / ASSESSMENT AND PLAN / ED COURSE  Pertinent labs & imaging results that were available during my care of the patient were reviewed by me and considered in my medical decision making (see chart for details).   This patient is Presenting for Evaluation of abdominal pain, which does require a range of treatment options, and is a complaint that involves a high risk of morbidity and mortality.  The Differential Diagnoses includes but is not exclusive to acute coronary syndrome, aortic dissection, pulmonary embolism, cardiac tamponade, community-acquired pneumonia, pericarditis, musculoskeletal chest wall pain, etc.   Critical Interventions-    Medications  sodium chloride 0.9 % bolus 500 mL (0 mLs Intravenous Stopped 03/11/23 2145)  ondansetron (ZOFRAN) injection 4 mg (4 mg Intravenous Given 03/11/23 1910)  morphine (PF) 4 MG/ML injection 4 mg (4 mg Intravenous Given 03/11/23 1910)  iohexol (OMNIPAQUE) 300 MG/ML solution 100 mL (100 mLs Intravenous Contrast Given 03/11/23 1857)   ondansetron (ZOFRAN) injection 4 mg (4 mg Intravenous Given 03/11/23 2151)  oxyCODONE-acetaminophen (PERCOCET/ROXICET) 5-325 MG per tablet 1 tablet (1 tablet Oral Given 03/11/23 2342)    Reassessment after intervention:  symptoms well controlled on PO medications.    I did obtain Additional Historical Information from family at bedside.   Clinical Laboratory Tests Ordered, included CBC without leukocytosis. No AKI. Troponin negative.   Radiologic Tests Ordered, included CXR and CT abdomen/pelvis. I independently interpreted the images and agree with radiology interpretation.   Cardiac Monitor Tracing which shows NSR.   Social Determinants of Health Risk patient is not an active smoker.   Medical Decision Making: Summary:  Patient presents emergency department with abdominal fullness and discomfort with some associated chest pain.  EKG and troponin normal.  Plan for CT abdomen pelvis.  Patient was seen in the ER on 9/4 but on that visit was having associated vertigo which drove most of the workup including CTA of the head and neck.  No abdominal imaging at that time.  Plan for labs, CT, reassess.  Reevaluation with update and discussion with patient. Discussed with US findings. He symptoms are well controlled. She has established care with OB/Gyn with endometrial biopsy yesterday. She will call in the AM to discussed abdominal imaging findings and make a close follow up plan.   Considered admission but symptoms are well controlled. Patient already closely established with Gyn. Stable for discharge.   Patient's presentation is most consistent with acute presentation with potential threat to life or bodily function.   Disposition: discharge  ____________________________________________  FINAL CLINICAL IMPRESSION(S) / ED DIAGNOSES  Final diagnoses:  Generalized abdominal pain  Chest pain, unspecified type  Ovarian mass, left  NEW OUTPATIENT MEDICATIONS STARTED DURING THIS  VISIT:  Discharge Medication List as of 03/11/2023 11:40 PM     START taking these medications   Details  oxyCODONE-acetaminophen (PERCOCET/ROXICET) 5-325 MG tablet Take 1 tablet by mouth every 6 (six) hours as needed for severe pain (pain score 7-10)., Starting Tue 03/11/2023, Normal    senna-docusate (SENOKOT-S) 8.6-50 MG tablet Take 1 tablet by mouth at bedtime as needed for mild constipation., Starting Tue 03/11/2023, Normal        Note:  This document was prepared using Dragon voice recognition software and may include unintentional dictation errors.  Alona Bene, MD, [facility] Emergency Medicine    Otie Headlee, Arlyss Repress, MD 03/12/23 [phone]

## 2023-03-11 NOTE — Discharge Instructions (Signed)
You were seen in emerged part today with abdominal pain.  Your workup was overall reassuring but we do see a mass on your left ovary.  Like you to follow with your OB/GYN in the morning by phone to make them aware of this.  They can guide further management from there.  If you develop sudden worsening pain, fevers, other sudden/severe symptoms please return to the emergency department for reevaluation.

## 2023-12-13 ENCOUNTER — Emergency Department (HOSPITAL_BASED_OUTPATIENT_CLINIC_OR_DEPARTMENT_OTHER)
Admission: EM | Admit: 2023-12-13 | Discharge: 2023-12-13 | Disposition: A | Discharge status: Home / Self Care | Payer: Self-pay | Attending: Emergency Medicine | Admitting: Emergency Medicine

## 2023-12-13 ENCOUNTER — Other Ambulatory Visit: Payer: Self-pay

## 2023-12-13 ENCOUNTER — Encounter (HOSPITAL_BASED_OUTPATIENT_CLINIC_OR_DEPARTMENT_OTHER): Payer: Self-pay | Admitting: Emergency Medicine

## 2023-12-13 DIAGNOSIS — S61304A Unspecified open wound of right ring finger with damage to nail, initial encounter: Secondary | ICD-10-CM | POA: Diagnosis not present

## 2023-12-13 DIAGNOSIS — W228XXA Striking against or struck by other objects, initial encounter: Secondary | ICD-10-CM | POA: Diagnosis not present

## 2023-12-13 DIAGNOSIS — S61309A Unspecified open wound of unspecified finger with damage to nail, initial encounter: Secondary | ICD-10-CM

## 2023-12-13 DIAGNOSIS — Z720 Tobacco use: Secondary | ICD-10-CM | POA: Diagnosis not present

## 2023-12-13 DIAGNOSIS — S6991XA Unspecified injury of right wrist, hand and finger(s), initial encounter: Secondary | ICD-10-CM | POA: Diagnosis present

## 2023-12-13 MED ORDER — CELECOXIB 200 MG PO CAPS: 200.0000 mg | ORAL_CAPSULE | Freq: Two times a day (BID) | ORAL | 0 refills | Status: AC | PRN

## 2023-12-13 MED ORDER — HYDROCODONE-ACETAMINOPHEN 5-325 MG PO TABS
1.0000 | ORAL_TABLET | Freq: Once | ORAL | Status: AC
  Administered 2023-12-13: 1 via ORAL
  Filled 2023-12-13: qty 1

## 2023-12-13 MED ORDER — LIDOCAINE HCL (PF) 1 % IJ SOLN
10.0000 mL | Freq: Once | INTRAMUSCULAR | Status: AC
  Administered 2023-12-13: 10 mL
  Filled 2023-12-13: qty 10
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
## 2023-12-13 NOTE — Discharge Instructions (Signed)
 As discussed, wash area gently with warm soapy water.  Change bandages at least daily.  Recommend follow-up with primary care for reassessment.  The sutures should fall out within the next 5 to 7 days or so along with the aluminum packing material.  If it falls off before then, please do not try to push the aluminum packing material back into the nailbed.  You may take Tylenol , ibuprofen for pain.  Recommend follow-up with your primary care for reassessment.
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
